# Patient Record
Sex: Male | Born: 1950 | Race: Black or African American | Hispanic: No | Marital: Married
Health system: Southern US, Community
[De-identification: ages and names within clinical notes are randomized; demographics above are authoritative.]

## PROBLEM LIST (undated history)

## (undated) DIAGNOSIS — E871 Hypo-osmolality and hyponatremia: Secondary | ICD-10-CM

## (undated) DIAGNOSIS — C9002 Multiple myeloma in relapse: Secondary | ICD-10-CM

## (undated) DIAGNOSIS — G4733 Obstructive sleep apnea (adult) (pediatric): Secondary | ICD-10-CM

## (undated) DIAGNOSIS — R5381 Other malaise: Secondary | ICD-10-CM

## (undated) DIAGNOSIS — I10 Essential (primary) hypertension: Secondary | ICD-10-CM

## (undated) DIAGNOSIS — G629 Polyneuropathy, unspecified: Secondary | ICD-10-CM

## (undated) DIAGNOSIS — K219 Gastro-esophageal reflux disease without esophagitis: Secondary | ICD-10-CM

## (undated) DIAGNOSIS — R634 Abnormal weight loss: Secondary | ICD-10-CM

## (undated) DIAGNOSIS — R630 Anorexia: Secondary | ICD-10-CM

## (undated) DIAGNOSIS — N400 Enlarged prostate without lower urinary tract symptoms: Secondary | ICD-10-CM

## (undated) HISTORY — DX: Essential (primary) hypertension: I10

## (undated) HISTORY — DX: Abnormal weight loss: R63.4

## (undated) HISTORY — DX: Obstructive sleep apnea (adult) (pediatric): G47.33

## (undated) HISTORY — DX: Hypo-osmolality and hyponatremia: E87.1

## (undated) HISTORY — DX: Polyneuropathy, unspecified: G62.9

## (undated) HISTORY — DX: Multiple myeloma in relapse: C90.02

## (undated) HISTORY — DX: Gastro-esophageal reflux disease without esophagitis: K21.9

## (undated) HISTORY — DX: Other malaise: R53.81

## (undated) HISTORY — DX: Anorexia: R63.0

## (undated) HISTORY — DX: Benign prostatic hyperplasia without lower urinary tract symptoms: N40.0

---

## 2000-06-17 ENCOUNTER — Encounter: Admission: RE | Admit: 2000-06-17 | Discharge: 2000-09-15 | Payer: Self-pay | Admitting: *Deleted

## 2002-03-24 ENCOUNTER — Encounter: Admission: RE | Admit: 2002-03-24 | Discharge: 2002-06-22 | Payer: Self-pay | Admitting: *Deleted

## 2002-08-18 ENCOUNTER — Encounter: Admission: RE | Admit: 2002-08-18 | Discharge: 2002-11-16 | Payer: Self-pay | Admitting: *Deleted

## 2006-01-21 ENCOUNTER — Encounter: Admission: RE | Admit: 2006-01-21 | Discharge: 2006-01-21 | Payer: Self-pay | Admitting: Internal Medicine

## 2006-12-05 ENCOUNTER — Encounter: Admission: RE | Admit: 2006-12-05 | Discharge: 2006-12-05 | Payer: Self-pay | Admitting: Internal Medicine

## 2007-09-01 ENCOUNTER — Encounter: Admission: RE | Admit: 2007-09-01 | Discharge: 2007-09-01 | Payer: Self-pay | Admitting: Internal Medicine

## 2008-06-01 ENCOUNTER — Encounter: Admission: RE | Admit: 2008-06-01 | Discharge: 2008-06-01 | Payer: Self-pay | Admitting: Internal Medicine

## 2008-08-14 ENCOUNTER — Ambulatory Visit (HOSPITAL_BASED_OUTPATIENT_CLINIC_OR_DEPARTMENT_OTHER): Admission: RE | Admit: 2008-08-14 | Discharge: 2008-08-14 | Payer: Self-pay | Admitting: Otolaryngology

## 2008-08-20 ENCOUNTER — Ambulatory Visit: Payer: Self-pay | Admitting: Internal Medicine

## 2009-02-13 ENCOUNTER — Ambulatory Visit (HOSPITAL_COMMUNITY): Admission: RE | Admit: 2009-02-13 | Discharge: 2009-02-13 | Payer: Self-pay | Admitting: Surgery

## 2009-02-15 ENCOUNTER — Ambulatory Visit (HOSPITAL_COMMUNITY): Admission: RE | Admit: 2009-02-15 | Discharge: 2009-02-15 | Payer: Self-pay | Admitting: Surgery

## 2009-03-02 ENCOUNTER — Ambulatory Visit (HOSPITAL_COMMUNITY): Admission: RE | Admit: 2009-03-02 | Discharge: 2009-03-02 | Payer: Self-pay | Admitting: Surgery

## 2009-03-02 ENCOUNTER — Encounter: Admission: RE | Admit: 2009-03-02 | Discharge: 2009-05-31 | Payer: Self-pay | Admitting: Surgery

## 2009-04-03 ENCOUNTER — Ambulatory Visit (HOSPITAL_COMMUNITY): Admission: RE | Admit: 2009-04-03 | Discharge: 2009-04-03 | Payer: Self-pay | Admitting: Surgery

## 2009-05-04 ENCOUNTER — Ambulatory Visit: Payer: Self-pay | Admitting: Oncology

## 2009-05-15 LAB — CBC WITH DIFFERENTIAL/PLATELET
BASO%: 0.6 % (ref 0.0–2.0)
Eosinophils Absolute: 0.1 10*3/uL (ref 0.0–0.5)
HCT: 33.5 % — ABNORMAL LOW (ref 38.4–49.9)
LYMPH%: 45.1 % (ref 14.0–49.0)
MONO#: 0.5 10*3/uL (ref 0.1–0.9)
NEUT#: 2.1 10*3/uL (ref 1.5–6.5)
Platelets: 75 10*3/uL — ABNORMAL LOW (ref 140–400)
RBC: 3.35 10*6/uL — ABNORMAL LOW (ref 4.20–5.82)
WBC: 4.9 10*3/uL (ref 4.0–10.3)
lymph#: 2.2 10*3/uL (ref 0.9–3.3)

## 2009-05-15 LAB — CHCC SMEAR

## 2009-05-15 LAB — MORPHOLOGY

## 2009-05-16 ENCOUNTER — Encounter: Payer: Self-pay | Admitting: Oncology

## 2009-05-16 ENCOUNTER — Other Ambulatory Visit: Admission: RE | Admit: 2009-05-16 | Discharge: 2009-05-16 | Payer: Self-pay | Admitting: Oncology

## 2009-05-16 LAB — LACTATE DEHYDROGENASE: LDH: 177 U/L (ref 94–250)

## 2009-05-17 LAB — PROTEIN ELECTROPHORESIS, SERUM
Alpha-2-Globulin: 7 % — ABNORMAL LOW (ref 7.1–11.8)
Beta 2: 1.3 % — ABNORMAL LOW (ref 3.2–6.5)
Beta Globulin: 5 % (ref 4.7–7.2)
Gamma Globulin: 28.5 % — ABNORMAL HIGH (ref 11.1–18.8)
M-Spike, %: 2.15 g/dL

## 2009-05-17 LAB — FERRITIN: Ferritin: 96 ng/mL (ref 22–322)

## 2009-05-17 LAB — IRON AND TIBC
%SAT: 40 % (ref 20–55)
Iron: 119 ug/dL (ref 42–165)
TIBC: 294 ug/dL (ref 215–435)
UIBC: 175 ug/dL

## 2009-05-17 LAB — COMPREHENSIVE METABOLIC PANEL
ALT: 15 U/L (ref 0–53)
BUN: 20 mg/dL (ref 6–23)
CO2: 29 mEq/L (ref 19–32)
Calcium: 9.1 mg/dL (ref 8.4–10.5)
Creatinine, Ser: 1.21 mg/dL (ref 0.40–1.50)
Glucose, Bld: 88 mg/dL (ref 70–99)
Total Bilirubin: 0.7 mg/dL (ref 0.3–1.2)

## 2009-05-19 ENCOUNTER — Ambulatory Visit (HOSPITAL_COMMUNITY): Admission: RE | Admit: 2009-05-19 | Discharge: 2009-05-19 | Payer: Self-pay | Admitting: Oncology

## 2009-05-23 LAB — IMMUNOFIXATION ELECTROPHORESIS
IgA: 9 mg/dL — ABNORMAL LOW (ref 68–378)
IgG (Immunoglobin G), Serum: 2670 mg/dL — ABNORMAL HIGH (ref 694–1618)
Total Protein, Serum Electrophoresis: 8.4 g/dL — ABNORMAL HIGH (ref 6.0–8.3)

## 2009-05-31 LAB — UIFE/LIGHT CHAINS/TP QN, 24-HR UR
Albumin, U: DETECTED
Free Kappa Lt Chains,Ur: 88.2 mg/dL — ABNORMAL HIGH (ref 0.04–1.51)
Free Lambda Excretion/Day: 1.58 mg/d
Free Lambda Lt Chains,Ur: 0.09 mg/dL (ref 0.08–1.01)
Gamma Globulin, Urine: DETECTED — AB
Time: 24 hours
Total Protein, Urine: 89.1 mg/dL
Volume, Urine: 1750 mL

## 2009-05-31 LAB — CREATININE CLEARANCE, URINE, 24 HOUR: Creatinine, Urine: 135.1 mg/dL

## 2009-06-07 LAB — COMPREHENSIVE METABOLIC PANEL
Alkaline Phosphatase: 33 U/L — ABNORMAL LOW (ref 39–117)
BUN: 14 mg/dL (ref 6–23)
Glucose, Bld: 134 mg/dL — ABNORMAL HIGH (ref 70–99)
Total Bilirubin: 0.9 mg/dL (ref 0.3–1.2)

## 2009-06-07 LAB — CBC WITH DIFFERENTIAL/PLATELET
Basophils Absolute: 0 10*3/uL (ref 0.0–0.1)
EOS%: 4.9 % (ref 0.0–7.0)
HCT: 28.4 % — ABNORMAL LOW (ref 38.4–49.9)
HGB: 9.8 g/dL — ABNORMAL LOW (ref 13.0–17.1)
LYMPH%: 38.6 % (ref 14.0–49.0)
MCH: 33.6 pg — ABNORMAL HIGH (ref 27.2–33.4)
MCV: 97.3 fL (ref 79.3–98.0)
MONO%: 15.7 % — ABNORMAL HIGH (ref 0.0–14.0)
NEUT%: 40.8 % (ref 39.0–75.0)
RDW: 13.8 % (ref 11.0–14.6)

## 2009-06-19 ENCOUNTER — Ambulatory Visit: Payer: Self-pay | Admitting: Oncology

## 2009-06-19 LAB — COMPREHENSIVE METABOLIC PANEL
AST: 16 U/L (ref 0–37)
Albumin: 3.5 g/dL (ref 3.5–5.2)
Alkaline Phosphatase: 38 U/L — ABNORMAL LOW (ref 39–117)
Potassium: 3.6 mEq/L (ref 3.5–5.3)
Sodium: 140 mEq/L (ref 135–145)
Total Protein: 5.9 g/dL — ABNORMAL LOW (ref 6.0–8.3)

## 2009-06-19 LAB — CBC WITH DIFFERENTIAL/PLATELET
BASO%: 0.2 % (ref 0.0–2.0)
EOS%: 7 % (ref 0.0–7.0)
MCH: 35.5 pg — ABNORMAL HIGH (ref 27.2–33.4)
MCHC: 35.3 g/dL (ref 32.0–36.0)
MCV: 100.8 fL — ABNORMAL HIGH (ref 79.3–98.0)
MONO%: 12.5 % (ref 0.0–14.0)
NEUT#: 1.8 10*3/uL (ref 1.5–6.5)
RBC: 2.91 10*6/uL — ABNORMAL LOW (ref 4.20–5.82)
RDW: 14.5 % (ref 11.0–14.6)

## 2009-06-21 LAB — PROTEIN ELECTROPHORESIS, SERUM
Albumin ELP: 61.3 % (ref 55.8–66.1)
Alpha-1-Globulin: 4.6 % (ref 2.9–4.9)
Alpha-2-Globulin: 8.9 % (ref 7.1–11.8)
Beta 2: 2.7 % — ABNORMAL LOW (ref 3.2–6.5)
Gamma Globulin: 17.2 % (ref 11.1–18.8)

## 2009-06-21 LAB — KAPPA/LAMBDA LIGHT CHAINS
Kappa free light chain: 4.58 mg/dL — ABNORMAL HIGH (ref 0.33–1.94)
Kappa:Lambda Ratio: 8.33 — ABNORMAL HIGH (ref 0.26–1.65)

## 2009-07-04 LAB — CBC WITH DIFFERENTIAL/PLATELET
Eosinophils Absolute: 1.2 10*3/uL — ABNORMAL HIGH (ref 0.0–0.5)
MONO#: 0.3 10*3/uL (ref 0.1–0.9)
NEUT#: 1.8 10*3/uL (ref 1.5–6.5)
Platelets: 110 10*3/uL — ABNORMAL LOW (ref 140–400)
RBC: 3.26 10*6/uL — ABNORMAL LOW (ref 4.20–5.82)
RDW: 13.7 % (ref 11.0–14.6)
WBC: 4.9 10*3/uL (ref 4.0–10.3)

## 2009-07-04 LAB — BASIC METABOLIC PANEL
CO2: 26 mEq/L (ref 19–32)
Chloride: 100 mEq/L (ref 96–112)
Glucose, Bld: 190 mg/dL — ABNORMAL HIGH (ref 70–99)
Potassium: 3.8 mEq/L (ref 3.5–5.3)
Sodium: 137 mEq/L (ref 135–145)

## 2009-07-14 LAB — CBC WITH DIFFERENTIAL/PLATELET
BASO%: 0.1 % (ref 0.0–2.0)
EOS%: 33.9 % — ABNORMAL HIGH (ref 0.0–7.0)
HCT: 32.5 % — ABNORMAL LOW (ref 38.4–49.9)
LYMPH%: 33 % (ref 14.0–49.0)
MCH: 34 pg — ABNORMAL HIGH (ref 27.2–33.4)
MCHC: 35 g/dL (ref 32.0–36.0)
MCV: 97.2 fL (ref 79.3–98.0)
MONO#: 0.3 10*3/uL (ref 0.1–0.9)
NEUT%: 27.4 % — ABNORMAL LOW (ref 39.0–75.0)
Platelets: 198 10*3/uL (ref 140–400)

## 2009-07-14 LAB — COMPREHENSIVE METABOLIC PANEL
ALT: 20 U/L (ref 0–53)
CO2: 30 mEq/L (ref 19–32)
Creatinine, Ser: 1.16 mg/dL (ref 0.40–1.50)
Total Bilirubin: 0.6 mg/dL (ref 0.3–1.2)

## 2009-07-18 LAB — PROTEIN ELECTROPHORESIS, SERUM
Beta Globulin: 5.8 % (ref 4.7–7.2)
M-Spike, %: 0.92 g/dL
Total Protein, Serum Electrophoresis: 6.4 g/dL (ref 6.0–8.3)

## 2009-07-18 LAB — KAPPA/LAMBDA LIGHT CHAINS: Lambda Free Lght Chn: 0.75 mg/dL (ref 0.57–2.63)

## 2009-07-18 LAB — IGG, IGA, IGM: IgG (Immunoglobin G), Serum: 1140 mg/dL (ref 694–1618)

## 2009-07-20 ENCOUNTER — Ambulatory Visit: Payer: Self-pay | Admitting: Oncology

## 2009-08-14 LAB — CBC WITH DIFFERENTIAL/PLATELET
BASO%: 0.2 % (ref 0.0–2.0)
EOS%: 24.4 % — ABNORMAL HIGH (ref 0.0–7.0)
HCT: 37.6 % — ABNORMAL LOW (ref 38.4–49.9)
LYMPH%: 29.2 % (ref 14.0–49.0)
MCH: 31.5 pg (ref 27.2–33.4)
MCHC: 33.4 g/dL (ref 32.0–36.0)
MCV: 94.4 fL (ref 79.3–98.0)
MONO%: 13.5 % (ref 0.0–14.0)
NEUT%: 32.7 % — ABNORMAL LOW (ref 39.0–75.0)
Platelets: 197 10*3/uL (ref 140–400)
RBC: 3.98 10*6/uL — ABNORMAL LOW (ref 4.20–5.82)
WBC: 4.7 10*3/uL (ref 4.0–10.3)

## 2009-08-16 LAB — COMPREHENSIVE METABOLIC PANEL
ALT: 15 U/L (ref 0–53)
Alkaline Phosphatase: 43 U/L (ref 39–117)
Creatinine, Ser: 1.07 mg/dL (ref 0.40–1.50)
Sodium: 139 mEq/L (ref 135–145)
Total Bilirubin: 0.5 mg/dL (ref 0.3–1.2)
Total Protein: 6.6 g/dL (ref 6.0–8.3)

## 2009-08-16 LAB — SPEP & IFE WITH QIG
Albumin ELP: 56 % (ref 55.8–66.1)
Alpha-2-Globulin: 9.3 % (ref 7.1–11.8)
IgG (Immunoglobin G), Serum: 1390 mg/dL (ref 694–1618)
M-Spike, %: 1.13 g/dL
Total Protein, Serum Electrophoresis: 6.6 g/dL (ref 6.0–8.3)

## 2009-08-17 ENCOUNTER — Ambulatory Visit: Payer: Self-pay | Admitting: Oncology

## 2009-09-14 ENCOUNTER — Ambulatory Visit: Payer: Self-pay | Admitting: Oncology

## 2009-09-14 ENCOUNTER — Ambulatory Visit (HOSPITAL_COMMUNITY): Admission: RE | Admit: 2009-09-14 | Discharge: 2009-09-14 | Payer: Self-pay | Admitting: Oncology

## 2009-09-14 ENCOUNTER — Encounter: Payer: Self-pay | Admitting: Oncology

## 2009-09-15 LAB — CBC WITH DIFFERENTIAL/PLATELET
Basophils Absolute: 0 10*3/uL (ref 0.0–0.1)
EOS%: 3 % (ref 0.0–7.0)
Eosinophils Absolute: 0.1 10*3/uL (ref 0.0–0.5)
HCT: 37.1 % — ABNORMAL LOW (ref 38.4–49.9)
HGB: 12.5 g/dL — ABNORMAL LOW (ref 13.0–17.1)
MCH: 30.9 pg (ref 27.2–33.4)
MCV: 91.5 fL (ref 79.3–98.0)
MONO%: 14.3 % — ABNORMAL HIGH (ref 0.0–14.0)
NEUT%: 42.2 % (ref 39.0–75.0)
Platelets: 223 10*3/uL (ref 140–400)

## 2009-09-19 LAB — COMPREHENSIVE METABOLIC PANEL
AST: 9 U/L (ref 0–37)
Alkaline Phosphatase: 37 U/L — ABNORMAL LOW (ref 39–117)
BUN: 15 mg/dL (ref 6–23)
Calcium: 8.3 mg/dL — ABNORMAL LOW (ref 8.4–10.5)
Creatinine, Ser: 1 mg/dL (ref 0.40–1.50)
Glucose, Bld: 121 mg/dL — ABNORMAL HIGH (ref 70–99)

## 2009-09-19 LAB — KAPPA/LAMBDA LIGHT CHAINS
Kappa:Lambda Ratio: 1.89 — ABNORMAL HIGH (ref 0.26–1.65)
Lambda Free Lght Chn: 0.55 mg/dL — ABNORMAL LOW (ref 0.57–2.63)

## 2009-09-19 LAB — SPEP & IFE WITH QIG
Albumin ELP: 57.5 % (ref 55.8–66.1)
Alpha-2-Globulin: 9.9 % (ref 7.1–11.8)
Beta Globulin: 6.2 % (ref 4.7–7.2)
Total Protein, Serum Electrophoresis: 6.2 g/dL (ref 6.0–8.3)

## 2009-09-21 ENCOUNTER — Ambulatory Visit: Payer: Self-pay | Admitting: Oncology

## 2009-09-25 LAB — BASIC METABOLIC PANEL
BUN: 15 mg/dL (ref 6–23)
Chloride: 101 mEq/L (ref 96–112)
Glucose, Bld: 186 mg/dL — ABNORMAL HIGH (ref 70–99)
Potassium: 4.1 mEq/L (ref 3.5–5.3)
Sodium: 136 mEq/L (ref 135–145)

## 2009-09-25 LAB — CBC WITH DIFFERENTIAL/PLATELET
Basophils Absolute: 0 10*3/uL (ref 0.0–0.1)
Eosinophils Absolute: 0.1 10*3/uL (ref 0.0–0.5)
HCT: 37.9 % — ABNORMAL LOW (ref 38.4–49.9)
LYMPH%: 27.6 % (ref 14.0–49.0)
MCV: 89.4 fL (ref 79.3–98.0)
MONO%: 10 % (ref 0.0–14.0)
NEUT#: 3.6 10*3/uL (ref 1.5–6.5)
NEUT%: 60.6 % (ref 39.0–75.0)
Platelets: 168 10*3/uL (ref 140–400)
RBC: 4.24 10*6/uL (ref 4.20–5.82)

## 2009-10-02 LAB — CBC WITH DIFFERENTIAL/PLATELET
BASO%: 0 % (ref 0.0–2.0)
EOS%: 5.5 % (ref 0.0–7.0)
HCT: 38.4 % (ref 38.4–49.9)
LYMPH%: 29.8 % (ref 14.0–49.0)
MCH: 29.8 pg (ref 27.2–33.4)
MCHC: 32.6 g/dL (ref 32.0–36.0)
MCV: 91.4 fL (ref 79.3–98.0)
MONO%: 12.1 % (ref 0.0–14.0)
NEUT%: 52.6 % (ref 39.0–75.0)
Platelets: 172 10*3/uL (ref 140–400)

## 2009-10-05 LAB — CBC WITH DIFFERENTIAL/PLATELET
BASO%: 0.1 % (ref 0.0–2.0)
EOS%: 2.6 % (ref 0.0–7.0)
MCH: 30.3 pg (ref 27.2–33.4)
MCHC: 33.2 g/dL (ref 32.0–36.0)
MONO%: 18.3 % — ABNORMAL HIGH (ref 0.0–14.0)
RDW: 14.1 % (ref 11.0–14.6)
lymph#: 2.6 10*3/uL (ref 0.9–3.3)

## 2009-10-05 LAB — BASIC METABOLIC PANEL
Calcium: 8.3 mg/dL — ABNORMAL LOW (ref 8.4–10.5)
Creatinine, Ser: 1.14 mg/dL (ref 0.40–1.50)
Glucose, Bld: 181 mg/dL — ABNORMAL HIGH (ref 70–99)
Sodium: 136 mEq/L (ref 135–145)

## 2009-10-05 LAB — TECHNOLOGIST REVIEW

## 2009-10-10 LAB — COMPREHENSIVE METABOLIC PANEL
AST: 17 U/L (ref 0–37)
Albumin: 3.1 g/dL — ABNORMAL LOW (ref 3.5–5.2)
BUN: 12 mg/dL (ref 6–23)
Calcium: 8.5 mg/dL (ref 8.4–10.5)
Chloride: 100 mEq/L (ref 96–112)
Creatinine, Ser: 1.13 mg/dL (ref 0.40–1.50)
Glucose, Bld: 168 mg/dL — ABNORMAL HIGH (ref 70–99)
Potassium: 3.5 mEq/L (ref 3.5–5.3)

## 2009-10-10 LAB — CBC WITH DIFFERENTIAL/PLATELET
Basophils Absolute: 0 10*3/uL (ref 0.0–0.1)
EOS%: 5.8 % (ref 0.0–7.0)
Eosinophils Absolute: 0.3 10*3/uL (ref 0.0–0.5)
HCT: 37.3 % — ABNORMAL LOW (ref 38.4–49.9)
HGB: 12.6 g/dL — ABNORMAL LOW (ref 13.0–17.1)
MCH: 31.2 pg (ref 27.2–33.4)
MCV: 92.4 fL (ref 79.3–98.0)
MONO%: 18.2 % — ABNORMAL HIGH (ref 0.0–14.0)
NEUT#: 2.9 10*3/uL (ref 1.5–6.5)
NEUT%: 57.6 % (ref 39.0–75.0)
lymph#: 0.9 10*3/uL (ref 0.9–3.3)

## 2009-10-12 LAB — SPEP & IFE WITH QIG
Alpha-1-Globulin: 4.3 % (ref 2.9–4.9)
Alpha-2-Globulin: 10.1 % (ref 7.1–11.8)
Beta 2: 2.5 % — ABNORMAL LOW (ref 3.2–6.5)
Beta Globulin: 5.9 % (ref 4.7–7.2)
Gamma Globulin: 22.7 % — ABNORMAL HIGH (ref 11.1–18.8)
IgG (Immunoglobin G), Serum: 1580 mg/dL (ref 694–1618)
M-Spike, %: 1.3 g/dL

## 2009-10-12 LAB — KAPPA/LAMBDA LIGHT CHAINS
Kappa free light chain: 1.33 mg/dL (ref 0.33–1.94)
Kappa:Lambda Ratio: 1.66 — ABNORMAL HIGH (ref 0.26–1.65)
Lambda Free Lght Chn: 0.8 mg/dL (ref 0.57–2.63)

## 2009-10-16 LAB — CBC WITH DIFFERENTIAL/PLATELET
BASO%: 0.4 % (ref 0.0–2.0)
Eosinophils Absolute: 0.3 10*3/uL (ref 0.0–0.5)
HCT: 36.9 % — ABNORMAL LOW (ref 38.4–49.9)
LYMPH%: 32 % (ref 14.0–49.0)
MCHC: 32.8 g/dL (ref 32.0–36.0)
MONO#: 0.6 10*3/uL (ref 0.1–0.9)
NEUT#: 2.7 10*3/uL (ref 1.5–6.5)
Platelets: 191 10*3/uL (ref 140–400)
RBC: 4.04 10*6/uL — ABNORMAL LOW (ref 4.20–5.82)
WBC: 5.2 10*3/uL (ref 4.0–10.3)
lymph#: 1.7 10*3/uL (ref 0.9–3.3)
nRBC: 0 % (ref 0–0)

## 2009-10-18 LAB — PROTEIN ELECTROPHORESIS, SERUM
Albumin ELP: 51 % — ABNORMAL LOW (ref 55.8–66.1)
Alpha-1-Globulin: 4.3 % (ref 2.9–4.9)
Alpha-2-Globulin: 10.4 % (ref 7.1–11.8)
Beta 2: 2.6 % — ABNORMAL LOW (ref 3.2–6.5)
Gamma Globulin: 25.9 % — ABNORMAL HIGH (ref 11.1–18.8)

## 2009-10-18 LAB — COMPREHENSIVE METABOLIC PANEL
Alkaline Phosphatase: 36 U/L — ABNORMAL LOW (ref 39–117)
BUN: 14 mg/dL (ref 6–23)
CO2: 25 mEq/L (ref 19–32)
Creatinine, Ser: 0.92 mg/dL (ref 0.40–1.50)
Glucose, Bld: 134 mg/dL — ABNORMAL HIGH (ref 70–99)
Sodium: 135 mEq/L (ref 135–145)
Total Bilirubin: 0.4 mg/dL (ref 0.3–1.2)
Total Protein: 7.2 g/dL (ref 6.0–8.3)

## 2009-10-18 LAB — IGG, IGA, IGM
IgA: 40 mg/dL — ABNORMAL LOW (ref 68–378)
IgG (Immunoglobin G), Serum: 2010 mg/dL — ABNORMAL HIGH (ref 694–1618)
IgM, Serum: 15 mg/dL — ABNORMAL LOW (ref 60–263)

## 2009-10-18 LAB — KAPPA/LAMBDA LIGHT CHAINS
Kappa free light chain: 1.12 mg/dL (ref 0.33–1.94)
Kappa:Lambda Ratio: 1.56 (ref 0.26–1.65)

## 2009-10-23 ENCOUNTER — Ambulatory Visit: Payer: Self-pay | Admitting: Oncology

## 2009-10-23 LAB — CBC WITH DIFFERENTIAL/PLATELET
Basophils Absolute: 0 10*3/uL (ref 0.0–0.1)
Eosinophils Absolute: 0.3 10*3/uL (ref 0.0–0.5)
HCT: 38 % — ABNORMAL LOW (ref 38.4–49.9)
HGB: 12.6 g/dL — ABNORMAL LOW (ref 13.0–17.1)
MONO#: 0.5 10*3/uL (ref 0.1–0.9)
NEUT#: 2.4 10*3/uL (ref 1.5–6.5)
NEUT%: 47.9 % (ref 39.0–75.0)
RDW: 14.4 % (ref 11.0–14.6)
lymph#: 1.9 10*3/uL (ref 0.9–3.3)

## 2009-10-23 LAB — COMPREHENSIVE METABOLIC PANEL
AST: 10 U/L (ref 0–37)
Albumin: 3.3 g/dL — ABNORMAL LOW (ref 3.5–5.2)
BUN: 13 mg/dL (ref 6–23)
CO2: 30 mEq/L (ref 19–32)
Calcium: 8.3 mg/dL — ABNORMAL LOW (ref 8.4–10.5)
Chloride: 97 mEq/L (ref 96–112)
Creatinine, Ser: 0.95 mg/dL (ref 0.40–1.50)
Glucose, Bld: 217 mg/dL — ABNORMAL HIGH (ref 70–99)
Potassium: 3.4 mEq/L — ABNORMAL LOW (ref 3.5–5.3)

## 2009-10-25 LAB — IGG, IGA, IGM: IgG (Immunoglobin G), Serum: 1910 mg/dL — ABNORMAL HIGH (ref 694–1618)

## 2009-10-25 LAB — PROTEIN ELECTROPHORESIS, SERUM
Beta 2: 2.5 % — ABNORMAL LOW (ref 3.2–6.5)
Beta Globulin: 5.6 % (ref 4.7–7.2)
Gamma Globulin: 26 % — ABNORMAL HIGH (ref 11.1–18.8)
M-Spike, %: 1.64 g/dL
Total Protein, Serum Electrophoresis: 6.7 g/dL (ref 6.0–8.3)

## 2009-10-30 LAB — CBC WITH DIFFERENTIAL/PLATELET
BASO%: 0.3 % (ref 0.0–2.0)
EOS%: 2.9 % (ref 0.0–7.0)
HCT: 37.6 % — ABNORMAL LOW (ref 38.4–49.9)
MCH: 31 pg (ref 27.2–33.4)
MCHC: 33.2 g/dL (ref 32.0–36.0)
MONO#: 1 10*3/uL — ABNORMAL HIGH (ref 0.1–0.9)
RDW: 15.1 % — ABNORMAL HIGH (ref 11.0–14.6)
WBC: 6 10*3/uL (ref 4.0–10.3)
lymph#: 1.3 10*3/uL (ref 0.9–3.3)

## 2009-10-30 LAB — COMPREHENSIVE METABOLIC PANEL
Albumin: 2.8 g/dL — ABNORMAL LOW (ref 3.5–5.2)
Alkaline Phosphatase: 39 U/L (ref 39–117)
BUN: 9 mg/dL (ref 6–23)
CO2: 31 mEq/L (ref 19–32)
Calcium: 8.3 mg/dL — ABNORMAL LOW (ref 8.4–10.5)
Chloride: 100 mEq/L (ref 96–112)
Glucose, Bld: 192 mg/dL — ABNORMAL HIGH (ref 70–99)
Potassium: 3.3 mEq/L — ABNORMAL LOW (ref 3.5–5.3)
Total Protein: 6.1 g/dL (ref 6.0–8.3)

## 2009-11-01 LAB — PROTEIN ELECTROPHORESIS, SERUM
Beta 2: 3 % — ABNORMAL LOW (ref 3.2–6.5)
Gamma Globulin: 23.8 % — ABNORMAL HIGH (ref 11.1–18.8)
M-Spike, %: 1.35 g/dL

## 2009-11-01 LAB — IGG, IGA, IGM
IgA: 23 mg/dL — ABNORMAL LOW (ref 68–378)
IgG (Immunoglobin G), Serum: 1480 mg/dL (ref 694–1618)
IgM, Serum: 14 mg/dL — ABNORMAL LOW (ref 60–263)

## 2009-11-01 LAB — KAPPA/LAMBDA LIGHT CHAINS: Kappa free light chain: 1 mg/dL (ref 0.33–1.94)

## 2009-11-06 LAB — CBC WITH DIFFERENTIAL/PLATELET
BASO%: 0.3 % (ref 0.0–2.0)
HCT: 37.5 % — ABNORMAL LOW (ref 38.4–49.9)
LYMPH%: 32.8 % (ref 14.0–49.0)
MCHC: 32.5 g/dL (ref 32.0–36.0)
MCV: 92.6 fL (ref 79.3–98.0)
MONO#: 0.6 10*3/uL (ref 0.1–0.9)
MONO%: 9.9 % (ref 0.0–14.0)
NEUT%: 52.3 % (ref 39.0–75.0)
Platelets: 182 10*3/uL (ref 140–400)
RBC: 4.05 10*6/uL — ABNORMAL LOW (ref 4.20–5.82)

## 2009-11-06 LAB — COMPREHENSIVE METABOLIC PANEL
AST: 16 U/L (ref 0–37)
Albumin: 3.6 g/dL (ref 3.5–5.2)
Alkaline Phosphatase: 38 U/L — ABNORMAL LOW (ref 39–117)
BUN: 21 mg/dL (ref 6–23)
Creatinine, Ser: 1.07 mg/dL (ref 0.40–1.50)
Glucose, Bld: 152 mg/dL — ABNORMAL HIGH (ref 70–99)
Total Bilirubin: 0.4 mg/dL (ref 0.3–1.2)

## 2009-11-16 ENCOUNTER — Ambulatory Visit (HOSPITAL_COMMUNITY): Admission: RE | Admit: 2009-11-16 | Discharge: 2009-11-16 | Payer: Self-pay | Admitting: Oncology

## 2009-11-16 ENCOUNTER — Ambulatory Visit: Payer: Self-pay | Admitting: Oncology

## 2009-11-16 LAB — CBC WITH DIFFERENTIAL/PLATELET
BASO%: 0 % (ref 0.0–2.0)
Basophils Absolute: 0 10*3/uL (ref 0.0–0.1)
EOS%: 0.4 % (ref 0.0–7.0)
Eosinophils Absolute: 0 10*3/uL (ref 0.0–0.5)
HCT: 38 % — ABNORMAL LOW (ref 38.4–49.9)
HGB: 12.4 g/dL — ABNORMAL LOW (ref 13.0–17.1)
LYMPH%: 37.6 % (ref 14.0–49.0)
MCH: 30.2 pg (ref 27.2–33.4)
MCHC: 32.6 g/dL (ref 32.0–36.0)
MCV: 92.7 fL (ref 79.3–98.0)
MONO#: 0.9 10*3/uL (ref 0.1–0.9)
MONO%: 12.3 % (ref 0.0–14.0)
NEUT#: 3.5 10*3/uL (ref 1.5–6.5)
NEUT%: 49.7 % (ref 39.0–75.0)
Platelets: 164 10*3/uL (ref 140–400)
RBC: 4.1 10*6/uL — ABNORMAL LOW (ref 4.20–5.82)
RDW: 15 % — ABNORMAL HIGH (ref 11.0–14.6)
WBC: 7 10*3/uL (ref 4.0–10.3)
lymph#: 2.6 10*3/uL (ref 0.9–3.3)
nRBC: 0 % (ref 0–0)

## 2009-11-20 LAB — PROTEIN ELECTROPHORESIS, SERUM
Alpha-2-Globulin: 10 % (ref 7.1–11.8)
Beta Globulin: 5 % (ref 4.7–7.2)
Gamma Globulin: 29.8 % — ABNORMAL HIGH (ref 11.1–18.8)
M-Spike, %: 1.75 g/dL
Total Protein, Serum Electrophoresis: 6.3 g/dL (ref 6.0–8.3)

## 2009-11-20 LAB — COMPREHENSIVE METABOLIC PANEL
ALT: 16 U/L (ref 0–53)
AST: 12 U/L (ref 0–37)
Albumin: 3 g/dL — ABNORMAL LOW (ref 3.5–5.2)
BUN: 18 mg/dL (ref 6–23)
CO2: 27 mEq/L (ref 19–32)
Calcium: 8.5 mg/dL (ref 8.4–10.5)
Chloride: 101 mEq/L (ref 96–112)
Creatinine, Ser: 0.94 mg/dL (ref 0.40–1.50)
Potassium: 3.6 mEq/L (ref 3.5–5.3)

## 2009-11-20 LAB — KAPPA/LAMBDA LIGHT CHAINS
Kappa free light chain: 1.02 mg/dL (ref 0.33–1.94)
Kappa:Lambda Ratio: 1.62 (ref 0.26–1.65)

## 2009-11-21 LAB — UIFE/LIGHT CHAINS/TP QN, 24-HR UR
Beta, Urine: DETECTED — AB
Free Lambda Lt Chains,Ur: 0.1 mg/dL (ref 0.08–1.01)
Free Lt Chn Excr Rate: 287.7 mg/d
Gamma Globulin, Urine: DETECTED — AB
Total Protein, Urine-Ur/day: 306 mg/d — ABNORMAL HIGH (ref 10–140)
Volume, Urine: 3000 mL

## 2009-12-07 ENCOUNTER — Ambulatory Visit: Payer: Self-pay | Admitting: Oncology

## 2009-12-11 LAB — CBC WITH DIFFERENTIAL/PLATELET
Basophils Absolute: 0 10*3/uL (ref 0.0–0.1)
Eosinophils Absolute: 0.1 10*3/uL (ref 0.0–0.5)
HCT: 33.3 % — ABNORMAL LOW (ref 38.4–49.9)
LYMPH%: 9.9 % — ABNORMAL LOW (ref 14.0–49.0)
MCV: 94.8 fL (ref 79.3–98.0)
MONO#: 0.1 10*3/uL (ref 0.1–0.9)
MONO%: 1.8 % (ref 0.0–14.0)
NEUT#: 2.7 10*3/uL (ref 1.5–6.5)
NEUT%: 85.7 % — ABNORMAL HIGH (ref 39.0–75.0)
Platelets: 142 10*3/uL (ref 140–400)
RBC: 3.51 10*6/uL — ABNORMAL LOW (ref 4.20–5.82)

## 2009-12-11 LAB — COMPREHENSIVE METABOLIC PANEL
ALT: 10 U/L (ref 0–53)
AST: 11 U/L (ref 0–37)
Albumin: 3.1 g/dL — ABNORMAL LOW (ref 3.5–5.2)
Alkaline Phosphatase: 32 U/L — ABNORMAL LOW (ref 39–117)
Calcium: 8.5 mg/dL (ref 8.4–10.5)
Chloride: 95 mEq/L — ABNORMAL LOW (ref 96–112)
Creatinine, Ser: 1.19 mg/dL (ref 0.40–1.50)
Potassium: 4 mEq/L (ref 3.5–5.3)

## 2009-12-13 LAB — CBC WITH DIFFERENTIAL/PLATELET
BASO%: 0.6 % (ref 0.0–2.0)
EOS%: 2.9 % (ref 0.0–7.0)
HCT: 31.9 % — ABNORMAL LOW (ref 38.4–49.9)
MCH: 32.3 pg (ref 27.2–33.4)
MCHC: 34.2 g/dL (ref 32.0–36.0)
MCV: 94.3 fL (ref 79.3–98.0)
MONO%: 5.8 % (ref 0.0–14.0)
NEUT%: 58.3 % (ref 39.0–75.0)
lymph#: 0.3 10*3/uL — ABNORMAL LOW (ref 0.9–3.3)

## 2009-12-13 LAB — COMPREHENSIVE METABOLIC PANEL
ALT: 11 U/L (ref 0–53)
AST: 9 U/L (ref 0–37)
Alkaline Phosphatase: 34 U/L — ABNORMAL LOW (ref 39–117)
BUN: 16 mg/dL (ref 6–23)
Calcium: 8.4 mg/dL (ref 8.4–10.5)
Chloride: 97 mEq/L (ref 96–112)
Creatinine, Ser: 1.05 mg/dL (ref 0.40–1.50)
Total Bilirubin: 0.6 mg/dL (ref 0.3–1.2)

## 2009-12-15 LAB — CBC WITH DIFFERENTIAL/PLATELET
Eosinophils Absolute: 0 10*3/uL (ref 0.0–0.5)
MONO#: 0.1 10*3/uL (ref 0.1–0.9)
MONO%: 20.1 % — ABNORMAL HIGH (ref 0.0–14.0)
NEUT#: 0 10*3/uL — CL (ref 1.5–6.5)
RBC: 3.31 10*6/uL — ABNORMAL LOW (ref 4.20–5.82)
RDW: 14.3 % (ref 11.0–14.6)
WBC: 0.5 10*3/uL — CL (ref 4.0–10.3)
lymph#: 0.3 10*3/uL — ABNORMAL LOW (ref 0.9–3.3)

## 2009-12-15 LAB — COMPREHENSIVE METABOLIC PANEL
Albumin: 2.7 g/dL — ABNORMAL LOW (ref 3.5–5.2)
Alkaline Phosphatase: 33 U/L — ABNORMAL LOW (ref 39–117)
CO2: 29 mEq/L (ref 19–32)
Glucose, Bld: 268 mg/dL — ABNORMAL HIGH (ref 70–99)
Potassium: 3.9 mEq/L (ref 3.5–5.3)
Sodium: 128 mEq/L — ABNORMAL LOW (ref 135–145)
Total Protein: 9.2 g/dL — ABNORMAL HIGH (ref 6.0–8.3)

## 2009-12-19 ENCOUNTER — Emergency Department (HOSPITAL_COMMUNITY): Admission: EM | Admit: 2009-12-19 | Discharge: 2009-12-19 | Payer: Self-pay | Admitting: Emergency Medicine

## 2009-12-27 LAB — CBC WITH DIFFERENTIAL/PLATELET
BASO%: 0.3 % (ref 0.0–2.0)
Eosinophils Absolute: 0 10*3/uL (ref 0.0–0.5)
HGB: 10.3 g/dL — ABNORMAL LOW (ref 13.0–17.1)
LYMPH%: 11.3 % — ABNORMAL LOW (ref 14.0–49.0)
MONO#: 0.4 10*3/uL (ref 0.1–0.9)
MONO%: 7.7 % (ref 0.0–14.0)
NEUT#: 4.5 10*3/uL (ref 1.5–6.5)
NEUT%: 80.7 % — ABNORMAL HIGH (ref 39.0–75.0)
RDW: 14.6 % (ref 11.0–14.6)
WBC: 5.6 10*3/uL (ref 4.0–10.3)
lymph#: 0.6 10*3/uL — ABNORMAL LOW (ref 0.9–3.3)

## 2010-01-01 LAB — PROTEIN ELECTROPHORESIS, SERUM
Albumin ELP: 39.3 % — ABNORMAL LOW (ref 55.8–66.1)
Alpha-1-Globulin: 4.1 % (ref 2.9–4.9)
Beta 2: 2.7 % — ABNORMAL LOW (ref 3.2–6.5)
Beta Globulin: 4.3 % — ABNORMAL LOW (ref 4.7–7.2)
Total Protein, Serum Electrophoresis: 8.4 g/dL — ABNORMAL HIGH (ref 6.0–8.3)

## 2010-01-01 LAB — COMPREHENSIVE METABOLIC PANEL
ALT: 10 U/L (ref 0–53)
Alkaline Phosphatase: 34 U/L — ABNORMAL LOW (ref 39–117)
Creatinine, Ser: 1.06 mg/dL (ref 0.40–1.50)
Sodium: 131 mEq/L — ABNORMAL LOW (ref 135–145)
Total Bilirubin: 0.3 mg/dL (ref 0.3–1.2)
Total Protein: 8.4 g/dL — ABNORMAL HIGH (ref 6.0–8.3)

## 2010-01-01 LAB — IGG, IGA, IGM
IgA: 21 mg/dL — ABNORMAL LOW (ref 68–378)
IgG (Immunoglobin G), Serum: 4390 mg/dL — ABNORMAL HIGH (ref 694–1618)

## 2010-01-01 LAB — KAPPA/LAMBDA LIGHT CHAINS: Lambda Free Lght Chn: <0.47 mg/dL — ABNORMAL LOW (ref 0.57–2.63)

## 2010-01-29 ENCOUNTER — Ambulatory Visit: Payer: Self-pay | Admitting: Oncology

## 2010-01-31 LAB — CBC WITH DIFFERENTIAL/PLATELET
BASO%: 0.3 % (ref 0.0–2.0)
Basophils Absolute: 0 10*3/uL (ref 0.0–0.1)
EOS%: 1.2 % (ref 0.0–7.0)
MCH: 32.1 pg (ref 27.2–33.4)
MCHC: 33.7 g/dL (ref 32.0–36.0)
MCV: 95.3 fL (ref 79.3–98.0)
MONO%: 13 % (ref 0.0–14.0)
NEUT%: 43.6 % (ref 39.0–75.0)
RDW: 15.5 % — ABNORMAL HIGH (ref 11.0–14.6)
lymph#: 4 10*3/uL — ABNORMAL HIGH (ref 0.9–3.3)

## 2010-01-31 LAB — COMPREHENSIVE METABOLIC PANEL
ALT: 11 U/L (ref 0–53)
AST: 16 U/L (ref 0–37)
Creatinine, Ser: 0.97 mg/dL (ref 0.40–1.50)
Total Bilirubin: 0.4 mg/dL (ref 0.3–1.2)

## 2010-03-19 ENCOUNTER — Ambulatory Visit: Payer: Self-pay | Admitting: Oncology

## 2010-03-26 LAB — COMPREHENSIVE METABOLIC PANEL
ALT: 11 U/L (ref 0–53)
Albumin: 3.4 g/dL — ABNORMAL LOW (ref 3.5–5.2)
Alkaline Phosphatase: 38 U/L — ABNORMAL LOW (ref 39–117)
Glucose, Bld: 98 mg/dL (ref 70–99)
Potassium: 3.8 mEq/L (ref 3.5–5.3)
Total Bilirubin: 0.4 mg/dL (ref 0.3–1.2)
Total Protein: 8.5 g/dL — ABNORMAL HIGH (ref 6.0–8.3)

## 2010-03-26 LAB — CBC WITH DIFFERENTIAL/PLATELET
BASO%: 0.3 % (ref 0.0–2.0)
Basophils Absolute: 0 10*3/uL (ref 0.0–0.1)
EOS%: 3.3 % (ref 0.0–7.0)
HGB: 12 g/dL — ABNORMAL LOW (ref 13.0–17.1)
MCH: 32.5 pg (ref 27.2–33.4)
MCV: 97.1 fL (ref 79.3–98.0)
MONO%: 8.8 % (ref 0.0–14.0)
NEUT#: 3.5 10*3/uL (ref 1.5–6.5)
RBC: 3.69 10*6/uL — ABNORMAL LOW (ref 4.20–5.82)
RDW: 13.9 % (ref 11.0–14.6)
lymph#: 2 10*3/uL (ref 0.9–3.3)

## 2010-05-08 ENCOUNTER — Ambulatory Visit: Payer: Self-pay | Admitting: Oncology

## 2010-05-09 LAB — CBC WITH DIFFERENTIAL/PLATELET
Basophils Absolute: 0 10*3/uL (ref 0.0–0.1)
Eosinophils Absolute: 0 10*3/uL (ref 0.0–0.5)
HCT: 33.4 % — ABNORMAL LOW (ref 38.4–49.9)
HGB: 11.5 g/dL — ABNORMAL LOW (ref 13.0–17.1)
LYMPH%: 16.8 % (ref 14.0–49.0)
MONO#: 0.7 10*3/uL (ref 0.1–0.9)
NEUT#: 3.7 10*3/uL (ref 1.5–6.5)
Platelets: 114 10*3/uL — ABNORMAL LOW (ref 140–400)
RBC: 3.52 10*6/uL — ABNORMAL LOW (ref 4.20–5.82)
WBC: 5.3 10*3/uL (ref 4.0–10.3)

## 2010-05-11 LAB — COMPREHENSIVE METABOLIC PANEL
ALT: 18 U/L (ref 0–53)
CO2: 23 mEq/L (ref 19–32)
Calcium: 8.9 mg/dL (ref 8.4–10.5)
Chloride: 99 mEq/L (ref 96–112)
Creatinine, Ser: 0.94 mg/dL (ref 0.40–1.50)
Glucose, Bld: 180 mg/dL — ABNORMAL HIGH (ref 70–99)
Total Protein: 8.3 g/dL (ref 6.0–8.3)

## 2010-05-11 LAB — PROTEIN ELECTROPHORESIS, SERUM
Alpha-1-Globulin: 6.7 % — ABNORMAL HIGH (ref 2.9–4.9)
Beta 2: 1.8 % — ABNORMAL LOW (ref 3.2–6.5)
Gamma Globulin: 31.8 % — ABNORMAL HIGH (ref 11.1–18.8)
M-Spike, %: 2.5 g/dL

## 2010-08-06 ENCOUNTER — Ambulatory Visit: Payer: Self-pay | Admitting: Oncology

## 2010-09-21 IMAGING — CR DG CHEST 2V
2 series · 2 of 2 positions shown · non-contrast
Comparison: 06/01/2008

CLINICAL DATA: Nonsmoker.  History of hypertension.  Bariatric
evaluation.

CHEST - 2 VIEW

[view not recorded (1 of 2)]
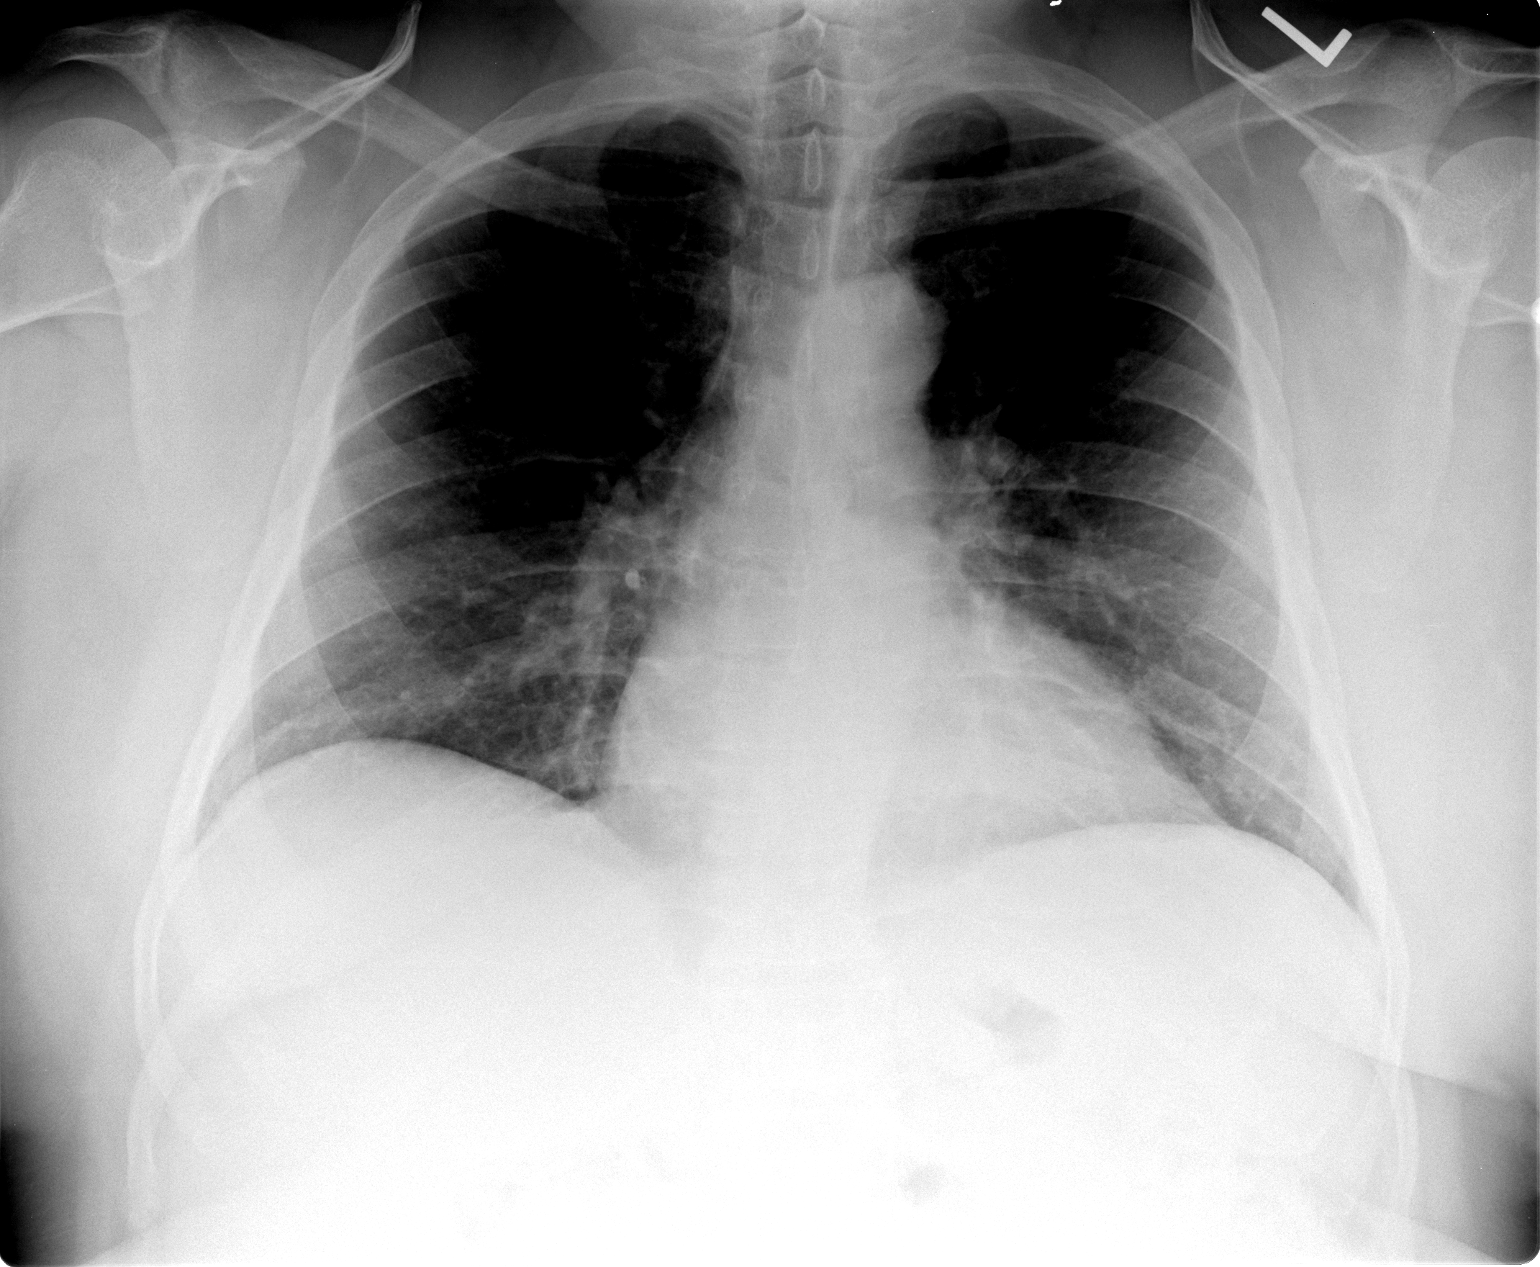

[view not recorded (2 of 2)]
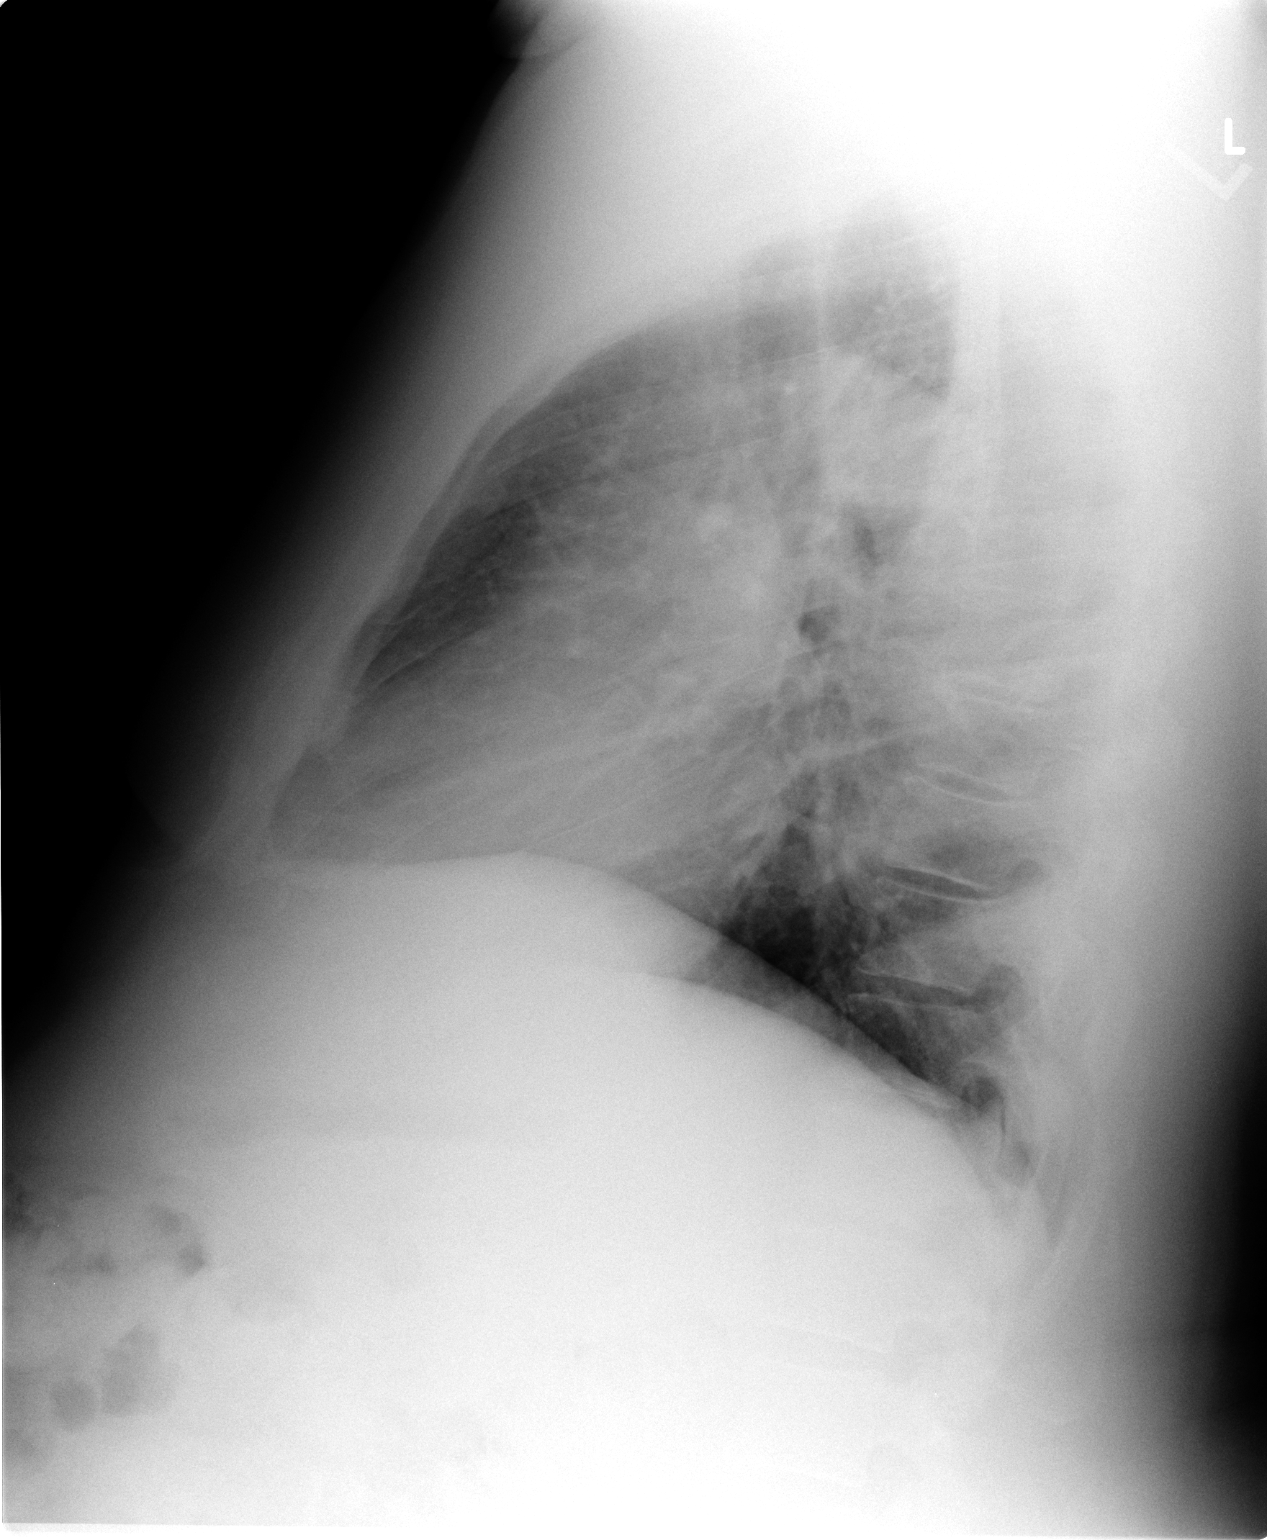

[2 of 2 positions shown; findings below may reference images not displayed]

FINDINGS: Heart size is mildly enlarged.  The there are no focal
consolidations or pleural effusions.  No evidence for pulmonary
edema.  Visualized bowel gas pattern is nonobstructive.
IMPRESSION: No evidence for acute cardiopulmonary disease.  Mild cardiomegaly.

REF:W1 DICTATED: 02/13/2009 [DATE]

## 2011-01-21 ENCOUNTER — Encounter: Payer: Self-pay | Admitting: Oncology

## 2011-01-29 ENCOUNTER — Emergency Department (HOSPITAL_COMMUNITY)
Admission: EM | Admit: 2011-01-29 | Discharge: 2011-01-29 | Payer: Self-pay | Source: Home / Self Care | Admitting: Emergency Medicine

## 2011-01-29 LAB — BASIC METABOLIC PANEL
CO2: 28 mEq/L (ref 19–32)
Calcium: 8.5 mg/dL (ref 8.4–10.5)
Chloride: 99 mEq/L (ref 96–112)
Glucose, Bld: 165 mg/dL — ABNORMAL HIGH (ref 70–99)
Potassium: 4.3 mEq/L (ref 3.5–5.1)
Sodium: 129 mEq/L — ABNORMAL LOW (ref 135–145)

## 2011-01-29 LAB — CBC
Hemoglobin: 8.7 g/dL — ABNORMAL LOW (ref 13.0–17.0)
MCH: 30.6 pg (ref 26.0–34.0)
MCHC: 34.4 g/dL (ref 30.0–36.0)
MCV: 89.1 fL (ref 78.0–100.0)
Platelets: 25 10*3/uL — CL (ref 150–400)
RBC: 2.84 MIL/uL — ABNORMAL LOW (ref 4.22–5.81)

## 2011-01-29 LAB — POCT CARDIAC MARKERS: Myoglobin, poc: 80.8 ng/mL (ref 12–200)

## 2011-01-29 LAB — DIFFERENTIAL
Basophils Relative: 0 % (ref 0–1)
Eosinophils Absolute: 0.1 10*3/uL (ref 0.0–0.7)
Lymphocytes Relative: 25 % (ref 12–46)
Lymphs Abs: 0.9 10*3/uL (ref 0.7–4.0)
Neutro Abs: 1.8 10*3/uL (ref 1.7–7.7)
Neutrophils Relative %: 55 % (ref 43–77)

## 2011-01-29 LAB — D-DIMER, QUANTITATIVE: D-Dimer, Quant: 1.34 ug/mL-FEU — ABNORMAL HIGH (ref 0.00–0.48)

## 2011-02-15 ENCOUNTER — Encounter (HOSPITAL_BASED_OUTPATIENT_CLINIC_OR_DEPARTMENT_OTHER): Payer: 59 | Admitting: Oncology

## 2011-02-15 ENCOUNTER — Encounter (HOSPITAL_COMMUNITY)
Admission: RE | Admit: 2011-02-15 | Discharge: 2011-02-15 | Disposition: A | Discharge status: Home / Self Care | Payer: 59 | Source: Ambulatory Visit | Attending: Oncology | Admitting: Oncology

## 2011-02-15 ENCOUNTER — Other Ambulatory Visit: Payer: Self-pay | Admitting: Oncology

## 2011-02-15 DIAGNOSIS — D630 Anemia in neoplastic disease: Secondary | ICD-10-CM | POA: Insufficient documentation

## 2011-02-15 DIAGNOSIS — C9000 Multiple myeloma not having achieved remission: Secondary | ICD-10-CM | POA: Insufficient documentation

## 2011-02-15 DIAGNOSIS — D696 Thrombocytopenia, unspecified: Secondary | ICD-10-CM

## 2011-02-15 DIAGNOSIS — E119 Type 2 diabetes mellitus without complications: Secondary | ICD-10-CM

## 2011-02-15 DIAGNOSIS — E876 Hypokalemia: Secondary | ICD-10-CM

## 2011-02-15 DIAGNOSIS — D649 Anemia, unspecified: Secondary | ICD-10-CM

## 2011-02-15 LAB — CBC WITH DIFFERENTIAL/PLATELET
Basophils Absolute: 0 10*3/uL (ref 0.0–0.1)
Eosinophils Absolute: 0.1 10*3/uL (ref 0.0–0.5)
HCT: 18.6 % — ABNORMAL LOW (ref 38.4–49.9)
HGB: 6.2 g/dL — CL (ref 13.0–17.1)
LYMPH%: 33.6 % (ref 14.0–49.0)
MCHC: 33.3 g/dL (ref 32.0–36.0)
MONO#: 0.6 10*3/uL (ref 0.1–0.9)
MONO%: 10.8 % (ref 0.0–14.0)
Platelets: 54 10*3/uL — ABNORMAL LOW (ref 140–400)
WBC: 5.3 10*3/uL (ref 4.0–10.3)
nRBC: 0 % (ref 0–0)

## 2011-02-15 LAB — HOLD TUBE, BLOOD BANK

## 2011-02-15 LAB — COMPREHENSIVE METABOLIC PANEL
ALT: 12 U/L (ref 0–53)
Albumin: 2.1 g/dL — ABNORMAL LOW (ref 3.5–5.2)
CO2: 22 mEq/L (ref 19–32)
Calcium: 8.2 mg/dL — ABNORMAL LOW (ref 8.4–10.5)
Chloride: 103 mEq/L (ref 96–112)
Glucose, Bld: 177 mg/dL — ABNORMAL HIGH (ref 70–99)
Sodium: 133 mEq/L — ABNORMAL LOW (ref 135–145)
Total Bilirubin: 0.7 mg/dL (ref 0.3–1.2)
Total Protein: 10.6 g/dL — ABNORMAL HIGH (ref 6.0–8.3)

## 2011-02-15 LAB — ABO/RH: ABO/RH(D): A POS

## 2011-02-16 ENCOUNTER — Encounter (HOSPITAL_BASED_OUTPATIENT_CLINIC_OR_DEPARTMENT_OTHER): Payer: 59 | Admitting: Oncology

## 2011-02-16 DIAGNOSIS — D649 Anemia, unspecified: Secondary | ICD-10-CM

## 2011-02-18 ENCOUNTER — Encounter (HOSPITAL_BASED_OUTPATIENT_CLINIC_OR_DEPARTMENT_OTHER): Payer: 59 | Admitting: Oncology

## 2011-02-18 DIAGNOSIS — D649 Anemia, unspecified: Secondary | ICD-10-CM

## 2011-02-19 LAB — CROSSMATCH
ABO/RH(D): A POS
Antibody Screen: NEGATIVE
Unit division: 0

## 2011-03-12 ENCOUNTER — Other Ambulatory Visit: Payer: Self-pay | Admitting: Oncology

## 2011-03-12 ENCOUNTER — Encounter (HOSPITAL_BASED_OUTPATIENT_CLINIC_OR_DEPARTMENT_OTHER): Payer: 59 | Admitting: Oncology

## 2011-03-12 DIAGNOSIS — D649 Anemia, unspecified: Secondary | ICD-10-CM

## 2011-03-12 DIAGNOSIS — D696 Thrombocytopenia, unspecified: Secondary | ICD-10-CM

## 2011-03-12 DIAGNOSIS — C9000 Multiple myeloma not having achieved remission: Secondary | ICD-10-CM

## 2011-03-12 DIAGNOSIS — N289 Disorder of kidney and ureter, unspecified: Secondary | ICD-10-CM

## 2011-03-12 DIAGNOSIS — E876 Hypokalemia: Secondary | ICD-10-CM

## 2011-03-12 LAB — TYPE & CROSSMATCH - CHCC

## 2011-03-12 LAB — CBC WITH DIFFERENTIAL/PLATELET
HCT: 16.6 % — ABNORMAL LOW (ref 38.4–49.9)
MCH: 32.9 pg (ref 27.2–33.4)
MCHC: 35.5 g/dL (ref 32.0–36.0)
MONO#: 0.3 10*3/uL (ref 0.1–0.9)
MONO%: 8 % (ref 0.0–14.0)
Platelets: 34 10*3/uL — ABNORMAL LOW (ref 140–400)
RBC: 1.8 10*6/uL — ABNORMAL LOW (ref 4.20–5.82)
WBC: 4.1 10*3/uL (ref 4.0–10.3)

## 2011-03-13 LAB — CROSSMATCH: Unit division: 0

## 2011-03-14 LAB — IGG, IGA, IGM
IgA: 7 mg/dL — ABNORMAL LOW (ref 68–378)
IgG (Immunoglobin G), Serum: 9170 mg/dL — ABNORMAL HIGH (ref 694–1618)
IgM, Serum: 13 mg/dL — ABNORMAL LOW (ref 60–263)

## 2011-03-14 LAB — PROTEIN ELECTROPHORESIS, SERUM
Albumin ELP: 28.9 % — ABNORMAL LOW (ref 55.8–66.1)
Alpha-1-Globulin: 2.8 % — ABNORMAL LOW (ref 2.9–4.9)
Alpha-2-Globulin: 6.5 % — ABNORMAL LOW (ref 7.1–11.8)
Beta Globulin: 2.1 % — ABNORMAL LOW (ref 4.7–7.2)
M-Spike, %: 6.76 g/dL
Total Protein, Serum Electrophoresis: 12.1 g/dL — ABNORMAL HIGH (ref 6.0–8.3)

## 2011-03-14 LAB — COMPREHENSIVE METABOLIC PANEL
Alkaline Phosphatase: 33 U/L — ABNORMAL LOW (ref 39–117)
Creatinine, Ser: 1.5 mg/dL (ref 0.40–1.50)
Glucose, Bld: 181 mg/dL — ABNORMAL HIGH (ref 70–99)
Sodium: 129 mEq/L — ABNORMAL LOW (ref 135–145)
Total Bilirubin: 0.3 mg/dL (ref 0.3–1.2)
Total Protein: 12.1 g/dL — ABNORMAL HIGH (ref 6.0–8.3)

## 2011-03-14 LAB — KAPPA/LAMBDA LIGHT CHAINS
Kappa free light chain: 69.7 mg/dL — ABNORMAL HIGH (ref 0.33–1.94)
Lambda Free Lght Chn: <0.42 mg/dL — ABNORMAL LOW (ref 0.57–2.63)

## 2011-03-14 LAB — MAGNESIUM: Magnesium: 1.6 mg/dL (ref 1.5–2.5)

## 2011-04-01 ENCOUNTER — Encounter (HOSPITAL_BASED_OUTPATIENT_CLINIC_OR_DEPARTMENT_OTHER): Payer: 59 | Admitting: Oncology

## 2011-04-01 ENCOUNTER — Other Ambulatory Visit: Payer: Self-pay | Admitting: Oncology

## 2011-04-01 ENCOUNTER — Ambulatory Visit (HOSPITAL_COMMUNITY)
Admission: RE | Admit: 2011-04-01 | Discharge: 2011-04-01 | Disposition: A | Discharge status: Home / Self Care | Payer: 59 | Source: Ambulatory Visit | Attending: Oncology | Admitting: Oncology

## 2011-04-01 DIAGNOSIS — C9000 Multiple myeloma not having achieved remission: Secondary | ICD-10-CM

## 2011-04-01 DIAGNOSIS — E876 Hypokalemia: Secondary | ICD-10-CM

## 2011-04-01 DIAGNOSIS — M25559 Pain in unspecified hip: Secondary | ICD-10-CM | POA: Insufficient documentation

## 2011-04-01 DIAGNOSIS — D649 Anemia, unspecified: Secondary | ICD-10-CM

## 2011-04-01 DIAGNOSIS — M25552 Pain in left hip: Secondary | ICD-10-CM

## 2011-04-01 DIAGNOSIS — D696 Thrombocytopenia, unspecified: Secondary | ICD-10-CM

## 2011-04-01 LAB — CBC WITH DIFFERENTIAL/PLATELET
BASO%: 0 % (ref 0.0–2.0)
Basophils Absolute: 0 10*3/uL (ref 0.0–0.1)
EOS%: 1.2 % (ref 0.0–7.0)
MCH: 32.8 pg (ref 27.2–33.4)
MCHC: 34.6 g/dL (ref 32.0–36.0)
MCV: 94.8 fL (ref 79.3–98.0)
MONO%: 6.5 % (ref 0.0–14.0)
RBC: 2.17 10*6/uL — ABNORMAL LOW (ref 4.20–5.82)
RDW: 19 % — ABNORMAL HIGH (ref 11.0–14.6)
lymph#: 0.9 10*3/uL (ref 0.9–3.3)

## 2011-04-01 LAB — COMPREHENSIVE METABOLIC PANEL
AST: 11 U/L (ref 0–37)
Albumin: 1.8 g/dL — ABNORMAL LOW (ref 3.5–5.2)
Alkaline Phosphatase: 31 U/L — ABNORMAL LOW (ref 39–117)
Glucose, Bld: 132 mg/dL — ABNORMAL HIGH (ref 70–99)
Potassium: 3.6 mEq/L (ref 3.5–5.3)
Sodium: 127 mEq/L — ABNORMAL LOW (ref 135–145)
Total Bilirubin: 0.4 mg/dL (ref 0.3–1.2)
Total Protein: 11.1 g/dL — ABNORMAL HIGH (ref 6.0–8.3)

## 2011-04-01 LAB — GLUCOSE, CAPILLARY

## 2011-04-03 LAB — CHROMOSOME ANALYSIS, BONE MARROW

## 2011-04-03 LAB — TISSUE HYBRIDIZATION (BONE MARROW)-NCBH

## 2011-04-04 ENCOUNTER — Ambulatory Visit: Payer: 59 | Attending: Radiation Oncology | Admitting: Radiation Oncology

## 2011-04-04 DIAGNOSIS — IMO0002 Reserved for concepts with insufficient information to code with codable children: Secondary | ICD-10-CM | POA: Insufficient documentation

## 2011-04-04 DIAGNOSIS — Y998 Other external cause status: Secondary | ICD-10-CM | POA: Insufficient documentation

## 2011-04-04 DIAGNOSIS — Z79899 Other long term (current) drug therapy: Secondary | ICD-10-CM | POA: Insufficient documentation

## 2011-04-04 DIAGNOSIS — Z510 Encounter for antineoplastic radiation therapy: Secondary | ICD-10-CM | POA: Insufficient documentation

## 2011-04-04 DIAGNOSIS — Z87891 Personal history of nicotine dependence: Secondary | ICD-10-CM | POA: Insufficient documentation

## 2011-04-04 DIAGNOSIS — D696 Thrombocytopenia, unspecified: Secondary | ICD-10-CM | POA: Insufficient documentation

## 2011-04-04 DIAGNOSIS — M25559 Pain in unspecified hip: Secondary | ICD-10-CM | POA: Insufficient documentation

## 2011-04-04 DIAGNOSIS — Y929 Unspecified place or not applicable: Secondary | ICD-10-CM | POA: Insufficient documentation

## 2011-04-04 DIAGNOSIS — X58XXXA Exposure to other specified factors, initial encounter: Secondary | ICD-10-CM | POA: Insufficient documentation

## 2011-04-04 DIAGNOSIS — R29898 Other symptoms and signs involving the musculoskeletal system: Secondary | ICD-10-CM | POA: Insufficient documentation

## 2011-04-04 DIAGNOSIS — E119 Type 2 diabetes mellitus without complications: Secondary | ICD-10-CM | POA: Insufficient documentation

## 2011-04-04 DIAGNOSIS — C9000 Multiple myeloma not having achieved remission: Secondary | ICD-10-CM | POA: Insufficient documentation

## 2011-04-04 DIAGNOSIS — M76899 Other specified enthesopathies of unspecified lower limb, excluding foot: Secondary | ICD-10-CM | POA: Insufficient documentation

## 2011-04-05 LAB — DIFFERENTIAL
Basophils Absolute: 0 10*3/uL (ref 0.0–0.1)
Lymphocytes Relative: 47 % — ABNORMAL HIGH (ref 12–46)
Lymphs Abs: 2.3 10*3/uL (ref 0.7–4.0)
Neutro Abs: 1.6 10*3/uL — ABNORMAL LOW (ref 1.7–7.7)

## 2011-04-05 LAB — TISSUE HYBRIDIZATION (BONE MARROW)-NCBH

## 2011-04-05 LAB — CBC
Hemoglobin: 12.6 g/dL — ABNORMAL LOW (ref 13.0–17.0)
Platelets: 198 10*3/uL (ref 150–400)
RDW: 14 % (ref 11.5–15.5)
WBC: 5.1 10*3/uL (ref 4.0–10.5)

## 2011-04-05 LAB — CHROMOSOME ANALYSIS, BONE MARROW

## 2011-04-05 LAB — BONE MARROW EXAM

## 2011-04-05 LAB — GLUCOSE, CAPILLARY: Glucose-Capillary: 121 mg/dL — ABNORMAL HIGH (ref 70–99)

## 2011-04-06 ENCOUNTER — Ambulatory Visit (HOSPITAL_COMMUNITY)
Admission: RE | Admit: 2011-04-06 | Discharge: 2011-04-06 | Disposition: A | Discharge status: Home / Self Care | Payer: 59 | Source: Ambulatory Visit | Attending: Oncology | Admitting: Oncology

## 2011-04-06 DIAGNOSIS — IMO0002 Reserved for concepts with insufficient information to code with codable children: Secondary | ICD-10-CM | POA: Insufficient documentation

## 2011-04-06 DIAGNOSIS — C9000 Multiple myeloma not having achieved remission: Secondary | ICD-10-CM | POA: Insufficient documentation

## 2011-04-06 DIAGNOSIS — M25552 Pain in left hip: Secondary | ICD-10-CM

## 2011-04-06 DIAGNOSIS — M79609 Pain in unspecified limb: Secondary | ICD-10-CM | POA: Insufficient documentation

## 2011-04-06 DIAGNOSIS — M25559 Pain in unspecified hip: Secondary | ICD-10-CM | POA: Insufficient documentation

## 2011-04-06 DIAGNOSIS — M8448XA Pathological fracture, other site, initial encounter for fracture: Secondary | ICD-10-CM | POA: Insufficient documentation

## 2011-04-06 DIAGNOSIS — X58XXXA Exposure to other specified factors, initial encounter: Secondary | ICD-10-CM | POA: Insufficient documentation

## 2011-04-06 MED ORDER — GADOBENATE DIMEGLUMINE 529 MG/ML IV SOLN
20.0000 mL | Freq: Once | INTRAVENOUS | Status: AC | PRN
  Administered 2011-04-06: 20 mL via INTRAVENOUS

## 2011-04-08 ENCOUNTER — Other Ambulatory Visit: Payer: Self-pay | Admitting: Oncology

## 2011-04-08 ENCOUNTER — Encounter (HOSPITAL_BASED_OUTPATIENT_CLINIC_OR_DEPARTMENT_OTHER): Payer: 59 | Admitting: Oncology

## 2011-04-08 ENCOUNTER — Encounter (HOSPITAL_COMMUNITY)
Admission: RE | Admit: 2011-04-08 | Discharge: 2011-04-08 | Disposition: A | Discharge status: Home / Self Care | Payer: 59 | Source: Ambulatory Visit | Attending: Oncology | Admitting: Oncology

## 2011-04-08 DIAGNOSIS — N289 Disorder of kidney and ureter, unspecified: Secondary | ICD-10-CM

## 2011-04-08 DIAGNOSIS — D630 Anemia in neoplastic disease: Secondary | ICD-10-CM | POA: Insufficient documentation

## 2011-04-08 DIAGNOSIS — C9000 Multiple myeloma not having achieved remission: Secondary | ICD-10-CM

## 2011-04-08 DIAGNOSIS — D649 Anemia, unspecified: Secondary | ICD-10-CM

## 2011-04-08 LAB — CBC WITH DIFFERENTIAL/PLATELET
BASO%: 0.2 % (ref 0.0–2.0)
LYMPH%: 31.7 % (ref 14.0–49.0)
MCHC: 32.7 g/dL (ref 32.0–36.0)
MONO#: 0.4 10*3/uL (ref 0.1–0.9)
RBC: 2.23 10*6/uL — ABNORMAL LOW (ref 4.20–5.82)
RDW: 17.9 % — ABNORMAL HIGH (ref 11.0–14.6)
WBC: 4.3 10*3/uL (ref 4.0–10.3)
lymph#: 1.4 10*3/uL (ref 0.9–3.3)
nRBC: 0 % (ref 0–0)

## 2011-04-08 LAB — PROTIME-INR: Protime: 15.6 Seconds — ABNORMAL HIGH (ref 10.6–13.4)

## 2011-04-09 LAB — BONE MARROW EXAM

## 2011-04-09 LAB — CROSSMATCH
ABO/RH(D): A POS
Antibody Screen: NEGATIVE
Unit division: 0

## 2011-04-10 ENCOUNTER — Encounter (HOSPITAL_BASED_OUTPATIENT_CLINIC_OR_DEPARTMENT_OTHER): Payer: 59 | Admitting: Oncology

## 2011-04-10 ENCOUNTER — Other Ambulatory Visit: Payer: Self-pay | Admitting: Oncology

## 2011-04-10 DIAGNOSIS — N289 Disorder of kidney and ureter, unspecified: Secondary | ICD-10-CM

## 2011-04-10 DIAGNOSIS — D649 Anemia, unspecified: Secondary | ICD-10-CM

## 2011-04-10 DIAGNOSIS — C9000 Multiple myeloma not having achieved remission: Secondary | ICD-10-CM

## 2011-04-10 LAB — CBC WITH DIFFERENTIAL/PLATELET
Basophils Absolute: 0 10*3/uL (ref 0.0–0.1)
EOS%: 1.4 % (ref 0.0–7.0)
HCT: 24.9 % — ABNORMAL LOW (ref 38.4–49.9)
HGB: 8 g/dL — ABNORMAL LOW (ref 13.0–17.1)
LYMPH%: 37 % (ref 14.0–49.0)
MCH: 30.1 pg (ref 27.2–33.4)
MCV: 93.6 fL (ref 79.3–98.0)
MONO%: 10.9 % (ref 0.0–14.0)
NEUT%: 50.5 % (ref 39.0–75.0)
Platelets: 23 10*3/uL — ABNORMAL LOW (ref 140–400)

## 2011-04-11 LAB — PREPARE PLATELETS: Unit division: 0

## 2011-04-12 ENCOUNTER — Encounter (HOSPITAL_COMMUNITY): Payer: 59

## 2011-04-16 ENCOUNTER — Encounter (HOSPITAL_COMMUNITY): Payer: 59

## 2011-04-22 ENCOUNTER — Other Ambulatory Visit: Payer: Self-pay | Admitting: Oncology

## 2011-04-22 ENCOUNTER — Encounter (HOSPITAL_COMMUNITY): Payer: 59

## 2011-04-22 ENCOUNTER — Encounter (HOSPITAL_BASED_OUTPATIENT_CLINIC_OR_DEPARTMENT_OTHER): Payer: 59 | Admitting: Oncology

## 2011-04-22 DIAGNOSIS — N289 Disorder of kidney and ureter, unspecified: Secondary | ICD-10-CM

## 2011-04-22 DIAGNOSIS — D61818 Other pancytopenia: Secondary | ICD-10-CM

## 2011-04-22 DIAGNOSIS — D649 Anemia, unspecified: Secondary | ICD-10-CM

## 2011-04-22 DIAGNOSIS — C9000 Multiple myeloma not having achieved remission: Secondary | ICD-10-CM

## 2011-04-22 LAB — CBC WITH DIFFERENTIAL/PLATELET
Basophils Absolute: 0 10*3/uL (ref 0.0–0.1)
EOS%: 3.1 % (ref 0.0–7.0)
Eosinophils Absolute: 0.1 10*3/uL (ref 0.0–0.5)
HCT: 17.4 % — ABNORMAL LOW (ref 38.4–49.9)
HGB: 6 g/dL — CL (ref 13.0–17.1)
MCH: 31.9 pg (ref 27.2–33.4)
MCV: 92 fL (ref 79.3–98.0)
MONO%: 12.8 % (ref 0.0–14.0)
NEUT%: 66.3 % (ref 39.0–75.0)

## 2011-04-22 LAB — COMPREHENSIVE METABOLIC PANEL
AST: 14 U/L (ref 0–37)
BUN: 8 mg/dL (ref 6–23)
Calcium: 8.2 mg/dL — ABNORMAL LOW (ref 8.4–10.5)
Chloride: 101 mEq/L (ref 96–112)
Creatinine, Ser: 0.99 mg/dL (ref 0.40–1.50)
Glucose, Bld: 111 mg/dL — ABNORMAL HIGH (ref 70–99)

## 2011-04-23 LAB — CROSSMATCH
Antibody Screen: NEGATIVE
Unit division: 0

## 2011-05-01 ENCOUNTER — Encounter (HOSPITAL_BASED_OUTPATIENT_CLINIC_OR_DEPARTMENT_OTHER): Payer: 59 | Admitting: Oncology

## 2011-05-01 ENCOUNTER — Other Ambulatory Visit: Payer: Self-pay | Admitting: Oncology

## 2011-05-01 ENCOUNTER — Other Ambulatory Visit: Payer: Self-pay | Admitting: Medical

## 2011-05-01 DIAGNOSIS — N289 Disorder of kidney and ureter, unspecified: Secondary | ICD-10-CM

## 2011-05-01 DIAGNOSIS — C9000 Multiple myeloma not having achieved remission: Secondary | ICD-10-CM

## 2011-05-01 DIAGNOSIS — D6489 Other specified anemias: Secondary | ICD-10-CM

## 2011-05-01 DIAGNOSIS — D649 Anemia, unspecified: Secondary | ICD-10-CM

## 2011-05-01 LAB — CBC WITH DIFFERENTIAL/PLATELET
Basophils Absolute: 0 10*3/uL (ref 0.0–0.1)
Eosinophils Absolute: 0.1 10*3/uL (ref 0.0–0.5)
HCT: 18.9 % — ABNORMAL LOW (ref 38.4–49.9)
HGB: 6.2 g/dL — CL (ref 13.0–17.1)
MCH: 30 pg (ref 27.2–33.4)
MONO#: 0.7 10*3/uL (ref 0.1–0.9)
NEUT#: 1.7 10*3/uL (ref 1.5–6.5)
NEUT%: 47.4 % (ref 39.0–75.0)
RDW: 17.4 % — ABNORMAL HIGH (ref 11.0–14.6)
lymph#: 1.2 10*3/uL (ref 0.9–3.3)

## 2011-05-03 ENCOUNTER — Encounter (HOSPITAL_BASED_OUTPATIENT_CLINIC_OR_DEPARTMENT_OTHER): Payer: 59 | Admitting: Oncology

## 2011-05-03 DIAGNOSIS — D649 Anemia, unspecified: Secondary | ICD-10-CM

## 2011-05-03 DIAGNOSIS — N289 Disorder of kidney and ureter, unspecified: Secondary | ICD-10-CM

## 2011-05-04 LAB — CROSSMATCH
ABO/RH(D): A POS
Antibody Screen: NEGATIVE
Unit division: 0

## 2011-05-14 NOTE — Procedures (Signed)
NAME:  Jason Rios, Jason Rios              ACCOUNT NO.:  [id number]   MEDICAL RECORD NO.:  [id number]          PATIENT TYPE:  OUT   LOCATION:  SLEEP CENTER                 FACILITY:  [facility]   PHYSICIAN:  Clinton D. Maple Hudson, MD, FCCP, FACPDATE OF BIRTH:  11-29-1952   DATE OF STUDY:  08/14/2008                            NOCTURNAL POLYSOMNOGRAM   REFERRING PHYSICIAN:  Antony Contras, MD   INDICATION FOR STUDY:  Hypersomnia with sleep apnea.   EPWORTH SLEEPINESS SCORE:  14/24.   BMI:  41.   WEIGHT:  274 pounds.   HEIGHT:  58 inches.   NECK:  19 inches.   MEDICATIONS:  Home medications charted and reviewed.   SLEEP ARCHITECTURE:  Split study protocol.  During the diagnostic phase,  total sleep time was 119.5 minutes with sleep efficiency 93%.  Stage 1  was 6.3%.  Stage 2 was 89.1%.  Stage 3 absent.  REM 4.6% of total sleep  time.  Sleep latency 3.5 minutes.  REM latency 52.5 minutes.  Awake  after sleep onset 6 minutes.  Arousal index 26.6.  No bedtime medication  was taken.   RESPIRATORY DATA:  Split study protocol.  Apnea/hypopnea index (AHI)  62.3 per hour.  There were 124 events scored, including 47 obstructive  apneas, 2 central apneas, 1 mixed apnea and 74 hypopneas before CPAP.  Events were not positional.  CPAP was titrated to 18 CWP, AHI 0 per  hour.  He chose a medium Mirage Quattro mask with heated humidifier.   OXYGEN DATA:  Loud snoring with oxygen desaturation to a nadir of 61%  before CPAP.  After CPAP control, mean oxygen saturation held 93.7% on  room air.   CARDIAC DATA:  Sinus rhythm with PVCs and PACs.   MOVEMENT/PARASOMNIA:  No significant movement disturbance.  Bathroom x1.   IMPRESSIONS/RECOMMENDATIONS:  1. Severe obstructive sleep apnea/hypopnea syndrome, AHI 62.3 per hour      with nonpositional events, loud snoring, and oxygen desaturation to      a nadir of 61%.  2. Successful CPAP titration to 18 CWP, AHI 0 per hour.  He chose a      medium Mirage  Quattro mask with heated humidifier.      Clinton D. Maple Hudson, MD, [facility], FACP  Diplomate, Biomedical engineer of Sleep Medicine  Electronically Signed     CDY/MEDQ  D:  08/20/2008 11:46:58  T:  08/20/2008 04:54:09  Job:  [id number]

## 2011-05-20 ENCOUNTER — Other Ambulatory Visit: Payer: Self-pay | Admitting: Oncology

## 2011-05-20 ENCOUNTER — Encounter (HOSPITAL_COMMUNITY)
Admission: RE | Admit: 2011-05-20 | Discharge: 2011-05-20 | Disposition: A | Discharge status: Home / Self Care | Payer: 59 | Source: Ambulatory Visit | Attending: Oncology | Admitting: Oncology

## 2011-05-20 ENCOUNTER — Encounter (HOSPITAL_BASED_OUTPATIENT_CLINIC_OR_DEPARTMENT_OTHER): Payer: 59 | Admitting: Oncology

## 2011-05-20 DIAGNOSIS — C9000 Multiple myeloma not having achieved remission: Secondary | ICD-10-CM | POA: Insufficient documentation

## 2011-05-20 DIAGNOSIS — N289 Disorder of kidney and ureter, unspecified: Secondary | ICD-10-CM

## 2011-05-20 DIAGNOSIS — D61818 Other pancytopenia: Secondary | ICD-10-CM

## 2011-05-20 DIAGNOSIS — D649 Anemia, unspecified: Secondary | ICD-10-CM

## 2011-05-20 DIAGNOSIS — D630 Anemia in neoplastic disease: Secondary | ICD-10-CM | POA: Insufficient documentation

## 2011-05-20 LAB — CBC WITH DIFFERENTIAL/PLATELET
Basophils Absolute: 0 10*3/uL (ref 0.0–0.1)
EOS%: 2.4 % (ref 0.0–7.0)
Eosinophils Absolute: 0.1 10*3/uL (ref 0.0–0.5)
HCT: 18.4 % — ABNORMAL LOW (ref 38.4–49.9)
HGB: 6.5 g/dL — CL (ref 13.0–17.1)
MCH: 31.4 pg (ref 27.2–33.4)
MCV: 88.9 fL (ref 79.3–98.0)
MONO%: 10.7 % (ref 0.0–14.0)
NEUT#: 1.4 10*3/uL — ABNORMAL LOW (ref 1.5–6.5)
NEUT%: 51.4 % (ref 39.0–75.0)
RDW: 17.2 % — ABNORMAL HIGH (ref 11.0–14.6)

## 2011-05-20 LAB — TYPE & CROSSMATCH - CHCC

## 2011-05-21 LAB — CROSSMATCH
ABO/RH(D): A POS
Antibody Screen: NEGATIVE
Unit division: 0
Unit division: 0

## 2011-05-23 ENCOUNTER — Ambulatory Visit: Payer: 59 | Admitting: Radiation Oncology

## 2011-05-23 ENCOUNTER — Ambulatory Visit
Admission: RE | Admit: 2011-05-23 | Discharge: 2011-05-23 | Disposition: A | Discharge status: Home / Self Care | Payer: 59 | Source: Ambulatory Visit | Attending: Radiation Oncology | Admitting: Radiation Oncology

## 2011-05-24 ENCOUNTER — Encounter (HOSPITAL_COMMUNITY): Payer: 59

## 2011-05-28 ENCOUNTER — Encounter (HOSPITAL_COMMUNITY): Payer: 59

## 2011-05-30 ENCOUNTER — Other Ambulatory Visit: Payer: Self-pay | Admitting: Oncology

## 2011-05-30 ENCOUNTER — Encounter (HOSPITAL_BASED_OUTPATIENT_CLINIC_OR_DEPARTMENT_OTHER): Payer: 59 | Admitting: Oncology

## 2011-05-30 DIAGNOSIS — D649 Anemia, unspecified: Secondary | ICD-10-CM

## 2011-05-30 DIAGNOSIS — D61818 Other pancytopenia: Secondary | ICD-10-CM

## 2011-05-30 DIAGNOSIS — C9000 Multiple myeloma not having achieved remission: Secondary | ICD-10-CM

## 2011-05-30 DIAGNOSIS — N289 Disorder of kidney and ureter, unspecified: Secondary | ICD-10-CM

## 2011-05-30 LAB — CBC WITH DIFFERENTIAL/PLATELET
BASO%: 0 % (ref 0.0–2.0)
EOS%: 1.2 % (ref 0.0–7.0)
MCH: 28.7 pg (ref 27.2–33.4)
MCHC: 32.9 g/dL (ref 32.0–36.0)
MONO#: 0.6 10*3/uL (ref 0.1–0.9)
RDW: 16 % — ABNORMAL HIGH (ref 11.0–14.6)
WBC: 5.1 10*3/uL (ref 4.0–10.3)
lymph#: 2.3 10*3/uL (ref 0.9–3.3)
nRBC: 0 % (ref 0–0)

## 2011-05-30 LAB — HOLD TUBE, BLOOD BANK

## 2011-05-31 ENCOUNTER — Encounter (HOSPITAL_BASED_OUTPATIENT_CLINIC_OR_DEPARTMENT_OTHER): Payer: 59 | Admitting: Oncology

## 2011-05-31 DIAGNOSIS — N289 Disorder of kidney and ureter, unspecified: Secondary | ICD-10-CM

## 2011-05-31 DIAGNOSIS — D649 Anemia, unspecified: Secondary | ICD-10-CM

## 2011-06-01 LAB — PREPARE PLATELET PHERESIS: Unit division: 0

## 2011-06-03 ENCOUNTER — Other Ambulatory Visit: Payer: Self-pay | Admitting: Medical

## 2011-06-03 ENCOUNTER — Encounter (HOSPITAL_BASED_OUTPATIENT_CLINIC_OR_DEPARTMENT_OTHER): Payer: 59 | Admitting: Oncology

## 2011-06-03 ENCOUNTER — Encounter (HOSPITAL_COMMUNITY): Payer: 59

## 2011-06-03 ENCOUNTER — Other Ambulatory Visit: Payer: Self-pay | Admitting: Oncology

## 2011-06-03 DIAGNOSIS — N289 Disorder of kidney and ureter, unspecified: Secondary | ICD-10-CM

## 2011-06-03 DIAGNOSIS — D630 Anemia in neoplastic disease: Secondary | ICD-10-CM | POA: Insufficient documentation

## 2011-06-03 DIAGNOSIS — D649 Anemia, unspecified: Secondary | ICD-10-CM

## 2011-06-03 DIAGNOSIS — D61818 Other pancytopenia: Secondary | ICD-10-CM

## 2011-06-03 DIAGNOSIS — C9000 Multiple myeloma not having achieved remission: Secondary | ICD-10-CM | POA: Insufficient documentation

## 2011-06-03 LAB — CBC WITH DIFFERENTIAL/PLATELET
Eosinophils Absolute: 0 10*3/uL (ref 0.0–0.5)
HCT: 20.8 % — ABNORMAL LOW (ref 38.4–49.9)
LYMPH%: 30.5 % (ref 14.0–49.0)
MCV: 87.8 fL (ref 79.3–98.0)
MONO#: 0.6 10*3/uL (ref 0.1–0.9)
MONO%: 13.4 % (ref 0.0–14.0)
NEUT#: 2.4 10*3/uL (ref 1.5–6.5)
NEUT%: 54.5 % (ref 39.0–75.0)
Platelets: 22 10*3/uL — ABNORMAL LOW (ref 140–400)
WBC: 4.4 10*3/uL (ref 4.0–10.3)

## 2011-06-03 LAB — COMPREHENSIVE METABOLIC PANEL
ALT: <8 U/L (ref 0–53)
AST: 10 U/L (ref 0–37)
Albumin: 2 g/dL — ABNORMAL LOW (ref 3.5–5.2)
Alkaline Phosphatase: 42 U/L (ref 39–117)
BUN: 24 mg/dL — ABNORMAL HIGH (ref 6–23)
Potassium: 4.3 mEq/L (ref 3.5–5.3)
Sodium: 125 mEq/L — ABNORMAL LOW (ref 135–145)
Total Protein: 13.7 g/dL — ABNORMAL HIGH (ref 6.0–8.3)

## 2011-06-03 LAB — TYPE & CROSSMATCH - CHCC

## 2011-06-04 LAB — CROSSMATCH: Unit division: 0

## 2011-06-05 ENCOUNTER — Other Ambulatory Visit: Payer: Self-pay | Admitting: Oncology

## 2011-06-05 LAB — BASIC METABOLIC PANEL
BUN: 22 mg/dL (ref 6–23)
Calcium: 9.4 mg/dL (ref 8.4–10.5)
Chloride: 94 mEq/L — ABNORMAL LOW (ref 96–112)
Creatinine, Ser: 1.38 mg/dL — ABNORMAL HIGH (ref 0.50–1.35)

## 2011-06-07 ENCOUNTER — Ambulatory Visit (HOSPITAL_COMMUNITY)
Admission: RE | Admit: 2011-06-07 | Discharge: 2011-06-07 | Disposition: A | Discharge status: Home / Self Care | Payer: 59 | Source: Ambulatory Visit | Attending: Radiation Oncology | Admitting: Radiation Oncology

## 2011-06-07 ENCOUNTER — Ambulatory Visit (HOSPITAL_COMMUNITY): Payer: 59

## 2011-06-07 ENCOUNTER — Encounter (HOSPITAL_COMMUNITY): Payer: 59

## 2011-06-07 ENCOUNTER — Other Ambulatory Visit: Payer: Self-pay | Admitting: Radiation Oncology

## 2011-06-07 ENCOUNTER — Ambulatory Visit
Admission: RE | Admit: 2011-06-07 | Discharge: 2011-06-07 | Disposition: A | Discharge status: Home / Self Care | Payer: 59 | Source: Ambulatory Visit | Attending: Radiation Oncology | Admitting: Radiation Oncology

## 2011-06-07 DIAGNOSIS — M25559 Pain in unspecified hip: Secondary | ICD-10-CM | POA: Insufficient documentation

## 2011-06-07 DIAGNOSIS — C9000 Multiple myeloma not having achieved remission: Secondary | ICD-10-CM | POA: Insufficient documentation

## 2011-06-13 ENCOUNTER — Ambulatory Visit: Payer: 59 | Admitting: Radiation Oncology

## 2011-06-17 ENCOUNTER — Encounter (HOSPITAL_BASED_OUTPATIENT_CLINIC_OR_DEPARTMENT_OTHER): Payer: 59 | Admitting: Oncology

## 2011-06-17 ENCOUNTER — Other Ambulatory Visit: Payer: Self-pay | Admitting: Oncology

## 2011-06-17 DIAGNOSIS — D61818 Other pancytopenia: Secondary | ICD-10-CM

## 2011-06-17 DIAGNOSIS — N289 Disorder of kidney and ureter, unspecified: Secondary | ICD-10-CM

## 2011-06-17 DIAGNOSIS — C9000 Multiple myeloma not having achieved remission: Secondary | ICD-10-CM

## 2011-06-17 DIAGNOSIS — D649 Anemia, unspecified: Secondary | ICD-10-CM

## 2011-06-17 LAB — CBC WITH DIFFERENTIAL/PLATELET
EOS%: 1.5 % (ref 0.0–7.0)
Eosinophils Absolute: 0.1 10*3/uL (ref 0.0–0.5)
MCV: 88.6 fL (ref 79.3–98.0)
MONO%: 10.9 % (ref 0.0–14.0)
NEUT#: 1.9 10*3/uL (ref 1.5–6.5)
RBC: 2.34 10*6/uL — ABNORMAL LOW (ref 4.20–5.82)
RDW: 15.8 % — ABNORMAL HIGH (ref 11.0–14.6)
lymph#: 1.2 10*3/uL (ref 0.9–3.3)

## 2011-06-18 LAB — PREPARE PLATELET PHERESIS

## 2011-06-24 ENCOUNTER — Other Ambulatory Visit: Payer: Self-pay | Admitting: Medical

## 2011-06-24 ENCOUNTER — Encounter (HOSPITAL_BASED_OUTPATIENT_CLINIC_OR_DEPARTMENT_OTHER): Payer: 59 | Admitting: Oncology

## 2011-06-24 ENCOUNTER — Encounter (HOSPITAL_COMMUNITY)
Admission: RE | Admit: 2011-06-24 | Discharge: 2011-06-24 | Disposition: A | Discharge status: Home / Self Care | Payer: 59 | Source: Ambulatory Visit | Attending: Oncology | Admitting: Oncology

## 2011-06-24 ENCOUNTER — Other Ambulatory Visit: Payer: Self-pay | Admitting: Oncology

## 2011-06-24 DIAGNOSIS — C9000 Multiple myeloma not having achieved remission: Secondary | ICD-10-CM

## 2011-06-24 DIAGNOSIS — D61818 Other pancytopenia: Secondary | ICD-10-CM

## 2011-06-24 LAB — CBC WITH DIFFERENTIAL/PLATELET
Basophils Absolute: 0 10*3/uL (ref 0.0–0.1)
EOS%: 1.6 % (ref 0.0–7.0)
HCT: 20.1 % — ABNORMAL LOW (ref 38.4–49.9)
HGB: 6.6 g/dL — CL (ref 13.0–17.1)
LYMPH%: 42.1 % (ref 14.0–49.0)
MCH: 29.2 pg (ref 27.2–33.4)
MCV: 88.9 fL (ref 79.3–98.0)
MONO%: 11.2 % (ref 0.0–14.0)
NEUT%: 45.1 % (ref 39.0–75.0)

## 2011-06-24 LAB — TYPE & CROSSMATCH - CHCC

## 2011-06-25 LAB — CROSSMATCH: Unit division: 0

## 2011-06-28 ENCOUNTER — Encounter (HOSPITAL_COMMUNITY): Payer: 59

## 2011-07-01 ENCOUNTER — Other Ambulatory Visit: Payer: Self-pay | Admitting: Oncology

## 2011-07-01 ENCOUNTER — Encounter (HOSPITAL_BASED_OUTPATIENT_CLINIC_OR_DEPARTMENT_OTHER): Payer: 59 | Admitting: Oncology

## 2011-07-01 DIAGNOSIS — D649 Anemia, unspecified: Secondary | ICD-10-CM

## 2011-07-01 DIAGNOSIS — N289 Disorder of kidney and ureter, unspecified: Secondary | ICD-10-CM

## 2011-07-01 DIAGNOSIS — C9000 Multiple myeloma not having achieved remission: Secondary | ICD-10-CM

## 2011-07-01 LAB — CBC WITH DIFFERENTIAL/PLATELET
Basophils Absolute: 0 10*3/uL (ref 0.0–0.1)
EOS%: 1.5 % (ref 0.0–7.0)
HGB: 8 g/dL — ABNORMAL LOW (ref 13.0–17.1)
MCH: 30.8 pg (ref 27.2–33.4)
NEUT#: 1.2 10*3/uL — ABNORMAL LOW (ref 1.5–6.5)
RDW: 15.9 % — ABNORMAL HIGH (ref 11.0–14.6)
lymph#: 1.2 10*3/uL (ref 0.9–3.3)

## 2011-07-01 LAB — HOLD TUBE, BLOOD BANK

## 2011-07-02 ENCOUNTER — Encounter (HOSPITAL_COMMUNITY): Payer: Medicare Other

## 2011-07-02 DIAGNOSIS — C9000 Multiple myeloma not having achieved remission: Secondary | ICD-10-CM | POA: Insufficient documentation

## 2011-07-02 DIAGNOSIS — D630 Anemia in neoplastic disease: Secondary | ICD-10-CM | POA: Insufficient documentation

## 2011-07-04 ENCOUNTER — Ambulatory Visit
Admission: RE | Admit: 2011-07-04 | Discharge: 2011-07-04 | Disposition: A | Discharge status: Home / Self Care | Payer: Medicare Other | Source: Ambulatory Visit | Attending: Radiation Oncology | Admitting: Radiation Oncology

## 2011-07-08 ENCOUNTER — Encounter (HOSPITAL_COMMUNITY): Payer: Medicare Other

## 2011-07-15 ENCOUNTER — Encounter (HOSPITAL_BASED_OUTPATIENT_CLINIC_OR_DEPARTMENT_OTHER): Payer: Medicare Other | Admitting: Oncology

## 2011-07-15 ENCOUNTER — Other Ambulatory Visit: Payer: Self-pay | Admitting: Oncology

## 2011-07-15 DIAGNOSIS — C9000 Multiple myeloma not having achieved remission: Secondary | ICD-10-CM

## 2011-07-15 DIAGNOSIS — N289 Disorder of kidney and ureter, unspecified: Secondary | ICD-10-CM

## 2011-07-15 DIAGNOSIS — D709 Neutropenia, unspecified: Secondary | ICD-10-CM

## 2011-07-15 DIAGNOSIS — D696 Thrombocytopenia, unspecified: Secondary | ICD-10-CM

## 2011-07-15 DIAGNOSIS — D649 Anemia, unspecified: Secondary | ICD-10-CM

## 2011-07-15 DIAGNOSIS — C9002 Multiple myeloma in relapse: Secondary | ICD-10-CM

## 2011-07-15 LAB — COMPREHENSIVE METABOLIC PANEL
ALT: <8 U/L (ref 0–53)
AST: 13 U/L (ref 0–37)
CO2: 21 mEq/L (ref 19–32)
Calcium: 6.8 mg/dL — ABNORMAL LOW (ref 8.4–10.5)
Chloride: 100 mEq/L (ref 96–112)
Potassium: 3.9 mEq/L (ref 3.5–5.3)
Sodium: 123 mEq/L — ABNORMAL LOW (ref 135–145)
Total Protein: 12.7 g/dL — ABNORMAL HIGH (ref 6.0–8.3)

## 2011-07-15 LAB — CBC WITH DIFFERENTIAL/PLATELET
Eosinophils Absolute: 0.1 10*3/uL (ref 0.0–0.5)
LYMPH%: 40.8 % (ref 14.0–49.0)
MONO#: 0.3 10*3/uL (ref 0.1–0.9)
NEUT#: 1.2 10*3/uL — ABNORMAL LOW (ref 1.5–6.5)
Platelets: 19 10*3/uL — ABNORMAL LOW (ref 140–400)
RBC: 2.18 10*6/uL — ABNORMAL LOW (ref 4.20–5.82)
RDW: 19.9 % — ABNORMAL HIGH (ref 11.0–14.6)
WBC: 2.6 10*3/uL — ABNORMAL LOW (ref 4.0–10.3)
lymph#: 1.1 10*3/uL (ref 0.9–3.3)
nRBC: 0 % (ref 0–0)

## 2011-07-15 LAB — HOLD TUBE, BLOOD BANK

## 2011-07-15 LAB — TYPE & CROSSMATCH - CHCC

## 2011-07-16 LAB — CROSSMATCH
ABO/RH(D): A POS
Antibody Screen: NEGATIVE

## 2011-07-19 ENCOUNTER — Other Ambulatory Visit: Payer: Self-pay | Admitting: Oncology

## 2011-07-19 ENCOUNTER — Ambulatory Visit (HOSPITAL_COMMUNITY)
Admission: RE | Admit: 2011-07-19 | Discharge: 2011-07-19 | Disposition: A | Discharge status: Home / Self Care | Payer: Medicare Other | Source: Ambulatory Visit | Attending: Oncology | Admitting: Oncology

## 2011-07-19 DIAGNOSIS — I517 Cardiomegaly: Secondary | ICD-10-CM | POA: Insufficient documentation

## 2011-07-19 DIAGNOSIS — R05 Cough: Secondary | ICD-10-CM | POA: Insufficient documentation

## 2011-07-19 DIAGNOSIS — R0602 Shortness of breath: Secondary | ICD-10-CM | POA: Insufficient documentation

## 2011-07-19 DIAGNOSIS — J3489 Other specified disorders of nose and nasal sinuses: Secondary | ICD-10-CM | POA: Insufficient documentation

## 2011-07-19 DIAGNOSIS — R059 Cough, unspecified: Secondary | ICD-10-CM | POA: Insufficient documentation

## 2011-07-19 DIAGNOSIS — Z87898 Personal history of other specified conditions: Secondary | ICD-10-CM | POA: Insufficient documentation

## 2011-07-22 ENCOUNTER — Encounter (HOSPITAL_BASED_OUTPATIENT_CLINIC_OR_DEPARTMENT_OTHER): Payer: Medicare Other | Admitting: Oncology

## 2011-07-22 ENCOUNTER — Other Ambulatory Visit: Payer: Self-pay | Admitting: Oncology

## 2011-07-22 DIAGNOSIS — D61818 Other pancytopenia: Secondary | ICD-10-CM

## 2011-07-22 DIAGNOSIS — C9000 Multiple myeloma not having achieved remission: Secondary | ICD-10-CM

## 2011-07-22 LAB — HOLD TUBE, BLOOD BANK

## 2011-07-22 LAB — CBC WITH DIFFERENTIAL/PLATELET
BASO%: 0.1 % (ref 0.0–2.0)
EOS%: 2.5 % (ref 0.0–7.0)
LYMPH%: 42.4 % (ref 14.0–49.0)
MCH: 32.1 pg (ref 27.2–33.4)
MCHC: 34.7 g/dL (ref 32.0–36.0)
MONO#: 0.3 10*3/uL (ref 0.1–0.9)
NEUT%: 43 % (ref 39.0–75.0)
Platelets: 20 10*3/uL — ABNORMAL LOW (ref 140–400)
RBC: 2.33 10*6/uL — ABNORMAL LOW (ref 4.20–5.82)
WBC: 2.6 10*3/uL — ABNORMAL LOW (ref 4.0–10.3)
lymph#: 1.1 10*3/uL (ref 0.9–3.3)

## 2011-07-22 LAB — SAMPLE TO BLOOD BANK

## 2011-07-29 ENCOUNTER — Other Ambulatory Visit: Payer: Self-pay | Admitting: Oncology

## 2011-07-29 ENCOUNTER — Encounter (HOSPITAL_BASED_OUTPATIENT_CLINIC_OR_DEPARTMENT_OTHER): Payer: Medicare Other | Admitting: Oncology

## 2011-07-29 ENCOUNTER — Encounter (HOSPITAL_COMMUNITY)
Admission: RE | Admit: 2011-07-29 | Discharge: 2011-07-29 | Disposition: A | Discharge status: Home / Self Care | Payer: Medicare Other | Source: Ambulatory Visit | Attending: Oncology | Admitting: Oncology

## 2011-07-29 DIAGNOSIS — D61818 Other pancytopenia: Secondary | ICD-10-CM

## 2011-07-29 DIAGNOSIS — C9000 Multiple myeloma not having achieved remission: Secondary | ICD-10-CM

## 2011-07-29 LAB — HOLD TUBE, BLOOD BANK

## 2011-07-29 LAB — CBC WITH DIFFERENTIAL/PLATELET
BASO%: 0 % (ref 0.0–2.0)
Eosinophils Absolute: 0.1 10*3/uL (ref 0.0–0.5)
LYMPH%: 37.6 % (ref 14.0–49.0)
MCHC: 32 g/dL (ref 32.0–36.0)
MONO#: 0.4 10*3/uL (ref 0.1–0.9)
NEUT#: 1.4 10*3/uL — ABNORMAL LOW (ref 1.5–6.5)
Platelets: 27 10*3/uL — ABNORMAL LOW (ref 140–400)
RBC: 2.19 10*6/uL — ABNORMAL LOW (ref 4.20–5.82)
WBC: 3 10*3/uL — ABNORMAL LOW (ref 4.0–10.3)
lymph#: 1.1 10*3/uL (ref 0.9–3.3)

## 2011-07-30 LAB — CROSSMATCH
ABO/RH(D): A POS
Antibody Screen: NEGATIVE

## 2011-08-12 ENCOUNTER — Encounter (HOSPITAL_BASED_OUTPATIENT_CLINIC_OR_DEPARTMENT_OTHER): Payer: Medicare Other | Admitting: Oncology

## 2011-08-12 ENCOUNTER — Other Ambulatory Visit: Payer: Self-pay | Admitting: Oncology

## 2011-08-12 DIAGNOSIS — D61818 Other pancytopenia: Secondary | ICD-10-CM

## 2011-08-12 DIAGNOSIS — C9000 Multiple myeloma not having achieved remission: Secondary | ICD-10-CM

## 2011-08-12 LAB — CBC WITH DIFFERENTIAL/PLATELET
Basophils Absolute: 0 10*3/uL (ref 0.0–0.1)
EOS%: 2.3 % (ref 0.0–7.0)
Eosinophils Absolute: 0.1 10*3/uL (ref 0.0–0.5)
HCT: 22 % — ABNORMAL LOW (ref 38.4–49.9)
HGB: 7.1 g/dL — ABNORMAL LOW (ref 13.0–17.1)
MCH: 31.4 pg (ref 27.2–33.4)
MCV: 97.3 fL (ref 79.3–98.0)
MONO%: 11.4 % (ref 0.0–14.0)
NEUT#: 1.5 10*3/uL (ref 1.5–6.5)
NEUT%: 50.4 % (ref 39.0–75.0)

## 2011-08-13 LAB — CROSSMATCH: ABO/RH(D): A POS

## 2011-08-26 ENCOUNTER — Other Ambulatory Visit: Payer: Self-pay | Admitting: Oncology

## 2011-08-26 ENCOUNTER — Encounter (HOSPITAL_BASED_OUTPATIENT_CLINIC_OR_DEPARTMENT_OTHER): Payer: Medicare Other | Admitting: Oncology

## 2011-08-26 DIAGNOSIS — D696 Thrombocytopenia, unspecified: Secondary | ICD-10-CM

## 2011-08-26 DIAGNOSIS — D61818 Other pancytopenia: Secondary | ICD-10-CM

## 2011-08-26 DIAGNOSIS — C9002 Multiple myeloma in relapse: Secondary | ICD-10-CM

## 2011-08-26 DIAGNOSIS — C9000 Multiple myeloma not having achieved remission: Secondary | ICD-10-CM

## 2011-08-26 DIAGNOSIS — D709 Neutropenia, unspecified: Secondary | ICD-10-CM

## 2011-08-26 LAB — COMPREHENSIVE METABOLIC PANEL
AST: 17 U/L (ref 0–37)
Alkaline Phosphatase: 44 U/L (ref 39–117)
BUN: 9 mg/dL (ref 6–23)
Creatinine, Ser: 0.72 mg/dL (ref 0.50–1.35)

## 2011-08-26 LAB — CBC WITH DIFFERENTIAL/PLATELET
BASO%: 0 % (ref 0.0–2.0)
EOS%: 2.6 % (ref 0.0–7.0)
HCT: 21.6 % — ABNORMAL LOW (ref 38.4–49.9)
LYMPH%: 34.9 % (ref 14.0–49.0)
MCH: 31.1 pg (ref 27.2–33.4)
MCHC: 32.4 g/dL (ref 32.0–36.0)
MONO%: 8.6 % (ref 0.0–14.0)
NEUT%: 53.9 % (ref 39.0–75.0)
Platelets: 28 10*3/uL — ABNORMAL LOW (ref 140–400)

## 2011-09-03 ENCOUNTER — Other Ambulatory Visit: Payer: Self-pay | Admitting: Oncology

## 2011-09-03 ENCOUNTER — Encounter (HOSPITAL_COMMUNITY)
Admission: RE | Admit: 2011-09-03 | Discharge: 2011-09-03 | Disposition: A | Discharge status: Home / Self Care | Source: Ambulatory Visit | Attending: Oncology | Admitting: Oncology

## 2011-09-03 ENCOUNTER — Encounter (HOSPITAL_BASED_OUTPATIENT_CLINIC_OR_DEPARTMENT_OTHER): Payer: Medicare Other | Admitting: Oncology

## 2011-09-03 DIAGNOSIS — D696 Thrombocytopenia, unspecified: Secondary | ICD-10-CM

## 2011-09-03 DIAGNOSIS — D630 Anemia in neoplastic disease: Secondary | ICD-10-CM | POA: Insufficient documentation

## 2011-09-03 DIAGNOSIS — C9000 Multiple myeloma not having achieved remission: Secondary | ICD-10-CM | POA: Insufficient documentation

## 2011-09-03 DIAGNOSIS — D61818 Other pancytopenia: Secondary | ICD-10-CM

## 2011-09-03 LAB — CBC WITH DIFFERENTIAL/PLATELET
Basophils Absolute: 0 10*3/uL (ref 0.0–0.1)
EOS%: 3.4 % (ref 0.0–7.0)
HCT: 20.9 % — ABNORMAL LOW (ref 38.4–49.9)
HGB: 6.8 g/dL — CL (ref 13.0–17.1)
MCH: 31.8 pg (ref 27.2–33.4)
MCV: 97.7 fL (ref 79.3–98.0)
NEUT%: 45.8 % (ref 39.0–75.0)
lymph#: 1.2 10*3/uL (ref 0.9–3.3)

## 2011-09-03 LAB — TYPE & CROSSMATCH - CHCC

## 2011-09-04 LAB — CROSSMATCH: Unit division: 0

## 2011-09-06 ENCOUNTER — Encounter (HOSPITAL_COMMUNITY): Payer: Medicare Other

## 2011-09-09 ENCOUNTER — Encounter (HOSPITAL_BASED_OUTPATIENT_CLINIC_OR_DEPARTMENT_OTHER): Payer: Medicare Other | Admitting: Oncology

## 2011-09-09 ENCOUNTER — Other Ambulatory Visit: Payer: Self-pay | Admitting: Oncology

## 2011-09-09 DIAGNOSIS — C9000 Multiple myeloma not having achieved remission: Secondary | ICD-10-CM

## 2011-09-09 DIAGNOSIS — D61818 Other pancytopenia: Secondary | ICD-10-CM

## 2011-09-09 LAB — CBC WITH DIFFERENTIAL/PLATELET
BASO%: 0 % (ref 0.0–2.0)
EOS%: 2.8 % (ref 0.0–7.0)
MCH: 31.1 pg (ref 27.2–33.4)
MCHC: 31.9 g/dL — ABNORMAL LOW (ref 32.0–36.0)
RBC: 2.38 10*6/uL — ABNORMAL LOW (ref 4.20–5.82)
RDW: 20.2 % — ABNORMAL HIGH (ref 11.0–14.6)
WBC: 2.9 10*3/uL — ABNORMAL LOW (ref 4.0–10.3)
lymph#: 1 10*3/uL (ref 0.9–3.3)
nRBC: 0 % (ref 0–0)

## 2011-09-09 LAB — HOLD TUBE, BLOOD BANK

## 2011-09-23 ENCOUNTER — Other Ambulatory Visit: Payer: Self-pay | Admitting: Oncology

## 2011-09-23 ENCOUNTER — Encounter (HOSPITAL_BASED_OUTPATIENT_CLINIC_OR_DEPARTMENT_OTHER): Admitting: Oncology

## 2011-09-23 DIAGNOSIS — D61818 Other pancytopenia: Secondary | ICD-10-CM

## 2011-09-23 DIAGNOSIS — C9002 Multiple myeloma in relapse: Secondary | ICD-10-CM

## 2011-09-23 DIAGNOSIS — C9000 Multiple myeloma not having achieved remission: Secondary | ICD-10-CM

## 2011-09-23 LAB — CBC WITH DIFFERENTIAL/PLATELET
BASO%: 0 % (ref 0.0–2.0)
LYMPH%: 35.3 % (ref 14.0–49.0)
MCHC: 32.7 g/dL (ref 32.0–36.0)
MONO#: 0.4 10*3/uL (ref 0.1–0.9)
Platelets: 16 10*3/uL — ABNORMAL LOW (ref 140–400)
RBC: 2.09 10*6/uL — ABNORMAL LOW (ref 4.20–5.82)
RDW: 19.8 % — ABNORMAL HIGH (ref 11.0–14.6)
WBC: 3.4 10*3/uL — ABNORMAL LOW (ref 4.0–10.3)
lymph#: 1.2 10*3/uL (ref 0.9–3.3)
nRBC: 0 % (ref 0–0)

## 2011-09-23 LAB — TYPE & CROSSMATCH - CHCC

## 2011-09-24 LAB — CROSSMATCH
Antibody Screen: NEGATIVE
Unit division: 0

## 2011-10-07 ENCOUNTER — Encounter (HOSPITAL_COMMUNITY)
Admission: RE | Admit: 2011-10-07 | Discharge: 2011-10-07 | Disposition: A | Discharge status: Home / Self Care | Payer: Medicare Other | Source: Ambulatory Visit | Attending: Oncology | Admitting: Oncology

## 2011-10-07 ENCOUNTER — Other Ambulatory Visit: Payer: Self-pay | Admitting: Oncology

## 2011-10-07 ENCOUNTER — Encounter (HOSPITAL_BASED_OUTPATIENT_CLINIC_OR_DEPARTMENT_OTHER): Admitting: Oncology

## 2011-10-07 DIAGNOSIS — C9000 Multiple myeloma not having achieved remission: Secondary | ICD-10-CM | POA: Insufficient documentation

## 2011-10-07 DIAGNOSIS — D61818 Other pancytopenia: Secondary | ICD-10-CM

## 2011-10-07 DIAGNOSIS — D696 Thrombocytopenia, unspecified: Secondary | ICD-10-CM

## 2011-10-07 DIAGNOSIS — D630 Anemia in neoplastic disease: Secondary | ICD-10-CM | POA: Insufficient documentation

## 2011-10-07 DIAGNOSIS — D709 Neutropenia, unspecified: Secondary | ICD-10-CM

## 2011-10-07 DIAGNOSIS — C9002 Multiple myeloma in relapse: Secondary | ICD-10-CM

## 2011-10-07 LAB — CBC WITH DIFFERENTIAL/PLATELET
BASO%: 0 % (ref 0.0–2.0)
EOS%: 2.3 % (ref 0.0–7.0)
HGB: 6.6 g/dL — CL (ref 13.0–17.1)
MCH: 31.4 pg (ref 27.2–33.4)
MCHC: 33 g/dL (ref 32.0–36.0)
RDW: 19.2 % — ABNORMAL HIGH (ref 11.0–14.6)
lymph#: 1.4 10*3/uL (ref 0.9–3.3)
nRBC: 0 % (ref 0–0)

## 2011-10-07 LAB — HOLD TUBE, BLOOD BANK

## 2011-10-07 LAB — COMPREHENSIVE METABOLIC PANEL
ALT: 10 U/L (ref 0–53)
BUN: 14 mg/dL (ref 6–23)
CO2: 23 mEq/L (ref 19–32)
Calcium: 6.8 mg/dL — ABNORMAL LOW (ref 8.4–10.5)
Chloride: 98 mEq/L (ref 96–112)
Creatinine, Ser: 0.92 mg/dL (ref 0.50–1.35)
Glucose, Bld: 107 mg/dL — ABNORMAL HIGH (ref 70–99)
Total Bilirubin: 0.3 mg/dL (ref 0.3–1.2)

## 2011-10-07 LAB — TYPE & CROSSMATCH - CHCC

## 2011-10-08 LAB — CROSSMATCH: ABO/RH(D): A POS

## 2011-10-09 ENCOUNTER — Encounter (HOSPITAL_BASED_OUTPATIENT_CLINIC_OR_DEPARTMENT_OTHER): Admitting: Oncology

## 2011-10-09 DIAGNOSIS — R634 Abnormal weight loss: Secondary | ICD-10-CM

## 2011-10-09 DIAGNOSIS — C9002 Multiple myeloma in relapse: Secondary | ICD-10-CM

## 2011-10-09 DIAGNOSIS — E871 Hypo-osmolality and hyponatremia: Secondary | ICD-10-CM

## 2011-10-09 DIAGNOSIS — D696 Thrombocytopenia, unspecified: Secondary | ICD-10-CM

## 2011-10-09 DIAGNOSIS — G629 Polyneuropathy, unspecified: Secondary | ICD-10-CM

## 2011-10-10 ENCOUNTER — Encounter: Payer: Self-pay | Admitting: Oncology

## 2011-10-10 DIAGNOSIS — C9002 Multiple myeloma in relapse: Secondary | ICD-10-CM | POA: Insufficient documentation

## 2011-10-10 DIAGNOSIS — E119 Type 2 diabetes mellitus without complications: Secondary | ICD-10-CM | POA: Insufficient documentation

## 2011-10-10 DIAGNOSIS — E871 Hypo-osmolality and hyponatremia: Secondary | ICD-10-CM | POA: Insufficient documentation

## 2011-10-10 DIAGNOSIS — D61818 Other pancytopenia: Secondary | ICD-10-CM | POA: Insufficient documentation

## 2011-10-10 DIAGNOSIS — R634 Abnormal weight loss: Secondary | ICD-10-CM | POA: Insufficient documentation

## 2011-10-10 DIAGNOSIS — G629 Polyneuropathy, unspecified: Secondary | ICD-10-CM | POA: Insufficient documentation

## 2011-10-10 LAB — PREPARE PLATELET PHERESIS

## 2011-10-11 ENCOUNTER — Encounter (HOSPITAL_BASED_OUTPATIENT_CLINIC_OR_DEPARTMENT_OTHER): Admitting: Oncology

## 2011-10-11 ENCOUNTER — Other Ambulatory Visit: Payer: Self-pay | Admitting: Oncology

## 2011-10-11 DIAGNOSIS — C9000 Multiple myeloma not having achieved remission: Secondary | ICD-10-CM

## 2011-10-11 DIAGNOSIS — C9002 Multiple myeloma in relapse: Secondary | ICD-10-CM

## 2011-10-11 DIAGNOSIS — D709 Neutropenia, unspecified: Secondary | ICD-10-CM

## 2011-10-11 DIAGNOSIS — D696 Thrombocytopenia, unspecified: Secondary | ICD-10-CM

## 2011-10-11 LAB — CBC WITH DIFFERENTIAL/PLATELET
BASO%: 0.2 % (ref 0.0–2.0)
Basophils Absolute: 0 10*3/uL (ref 0.0–0.1)
EOS%: 2.3 % (ref 0.0–7.0)
HGB: 7.2 g/dL — ABNORMAL LOW (ref 13.0–17.1)
MCH: 30.6 pg (ref 27.2–33.4)
MCHC: 32.4 g/dL (ref 32.0–36.0)
MCV: 94.5 fL (ref 79.3–98.0)
MONO%: 8.1 % (ref 0.0–14.0)
NEUT%: 42.3 % (ref 39.0–75.0)
RDW: 19 % — ABNORMAL HIGH (ref 11.0–14.6)

## 2011-10-11 LAB — HOLD TUBE, BLOOD BANK

## 2011-10-17 ENCOUNTER — Other Ambulatory Visit (HOSPITAL_COMMUNITY): Payer: Self-pay | Admitting: Oncology

## 2011-10-17 DIAGNOSIS — C9002 Multiple myeloma in relapse: Secondary | ICD-10-CM

## 2011-10-19 ENCOUNTER — Encounter: Payer: Self-pay | Admitting: Oncology

## 2011-10-19 ENCOUNTER — Other Ambulatory Visit: Payer: Self-pay | Admitting: Oncology

## 2011-10-19 DIAGNOSIS — N400 Enlarged prostate without lower urinary tract symptoms: Secondary | ICD-10-CM | POA: Insufficient documentation

## 2011-10-21 ENCOUNTER — Other Ambulatory Visit: Payer: Self-pay | Admitting: Oncology

## 2011-10-21 ENCOUNTER — Encounter (HOSPITAL_BASED_OUTPATIENT_CLINIC_OR_DEPARTMENT_OTHER): Payer: Medicare Other | Admitting: Oncology

## 2011-10-21 DIAGNOSIS — C9000 Multiple myeloma not having achieved remission: Secondary | ICD-10-CM

## 2011-10-21 DIAGNOSIS — C9002 Multiple myeloma in relapse: Secondary | ICD-10-CM

## 2011-10-21 DIAGNOSIS — D709 Neutropenia, unspecified: Secondary | ICD-10-CM

## 2011-10-21 DIAGNOSIS — D696 Thrombocytopenia, unspecified: Secondary | ICD-10-CM

## 2011-10-21 LAB — CBC WITH DIFFERENTIAL/PLATELET
BASO%: 0.2 % (ref 0.0–2.0)
EOS%: 0.6 % (ref 0.0–7.0)
LYMPH%: 44.2 % (ref 14.0–49.0)
MCH: 30.5 pg (ref 27.2–33.4)
MCHC: 32.7 g/dL (ref 32.0–36.0)
MCV: 93.2 fL (ref 79.3–98.0)
MONO%: 7.7 % (ref 0.0–14.0)
NEUT#: 2.3 10*3/uL (ref 1.5–6.5)
Platelets: 7 10*3/uL — CL (ref 140–400)
RBC: 1.77 10*6/uL — ABNORMAL LOW (ref 4.20–5.82)
RDW: 19.1 % — ABNORMAL HIGH (ref 11.0–14.6)
nRBC: 1 % — ABNORMAL HIGH (ref 0–0)

## 2011-10-21 LAB — HOLD TUBE, BLOOD BANK

## 2011-10-22 ENCOUNTER — Other Ambulatory Visit: Payer: Self-pay | Admitting: Oncology

## 2011-10-22 LAB — CROSSMATCH: Unit division: 0

## 2011-10-22 LAB — PREPARE PLATELET PHERESIS: Unit division: 0

## 2011-10-23 ENCOUNTER — Emergency Department (HOSPITAL_COMMUNITY)

## 2011-10-23 ENCOUNTER — Emergency Department (HOSPITAL_COMMUNITY)
Admission: EM | Admit: 2011-10-23 | Discharge: 2011-10-31 | Disposition: E | Attending: Emergency Medicine | Admitting: Emergency Medicine

## 2011-10-23 DIAGNOSIS — R195 Other fecal abnormalities: Secondary | ICD-10-CM | POA: Insufficient documentation

## 2011-10-23 DIAGNOSIS — Z87898 Personal history of other specified conditions: Secondary | ICD-10-CM | POA: Insufficient documentation

## 2011-10-23 DIAGNOSIS — I629 Nontraumatic intracranial hemorrhage, unspecified: Secondary | ICD-10-CM | POA: Insufficient documentation

## 2011-10-23 DIAGNOSIS — R6889 Other general symptoms and signs: Secondary | ICD-10-CM | POA: Insufficient documentation

## 2011-10-23 DIAGNOSIS — R0602 Shortness of breath: Secondary | ICD-10-CM | POA: Insufficient documentation

## 2011-10-23 DIAGNOSIS — E119 Type 2 diabetes mellitus without complications: Secondary | ICD-10-CM | POA: Insufficient documentation

## 2011-10-23 DIAGNOSIS — I10 Essential (primary) hypertension: Secondary | ICD-10-CM | POA: Insufficient documentation

## 2011-10-23 DIAGNOSIS — R404 Transient alteration of awareness: Secondary | ICD-10-CM | POA: Insufficient documentation

## 2011-10-23 LAB — POCT I-STAT 3, ART BLOOD GAS (G3+)
Acid-Base Excess: 4 mmol/L — ABNORMAL HIGH (ref 0.0–2.0)
Bicarbonate: 28.2 mEq/L — ABNORMAL HIGH (ref 20.0–24.0)
Patient temperature: 102.4

## 2011-10-23 LAB — POCT I-STAT, CHEM 8
HCT: 26 % — ABNORMAL LOW (ref 39.0–52.0)
Hemoglobin: 8.8 g/dL — ABNORMAL LOW (ref 13.0–17.0)
Potassium: 3.7 mEq/L (ref 3.5–5.1)
Sodium: 147 mEq/L — ABNORMAL HIGH (ref 135–145)
TCO2: 25 mmol/L (ref 0–100)

## 2011-10-23 LAB — CBC
Hemoglobin: 6.2 g/dL — CL (ref 13.0–17.0)
MCHC: 33.7 g/dL (ref 30.0–36.0)
RDW: 18.4 % — ABNORMAL HIGH (ref 11.5–15.5)

## 2011-10-23 LAB — URINE MICROSCOPIC-ADD ON

## 2011-10-23 LAB — POCT I-STAT TROPONIN I: Troponin i, poc: 0.07 ng/mL (ref 0.00–0.08)

## 2011-10-23 LAB — DIFFERENTIAL
Basophils Absolute: 0 10*3/uL (ref 0.0–0.1)
Eosinophils Absolute: 0 10*3/uL (ref 0.0–0.7)
Lymphocytes Relative: 39 % (ref 12–46)
Lymphs Abs: 1.8 10*3/uL (ref 0.7–4.0)
Neutro Abs: 2.3 10*3/uL (ref 1.7–7.7)

## 2011-10-23 LAB — URINALYSIS, ROUTINE W REFLEX MICROSCOPIC
Specific Gravity, Urine: 1.033 — ABNORMAL HIGH (ref 1.005–1.030)
pH: 6 (ref 5.0–8.0)

## 2011-10-23 LAB — PROTIME-INR
INR: 1.72 — ABNORMAL HIGH (ref 0.00–1.49)
Prothrombin Time: 20.5 seconds — ABNORMAL HIGH (ref 11.6–15.2)

## 2011-10-23 LAB — OCCULT BLOOD, POC DEVICE: Fecal Occult Bld: POSITIVE

## 2011-10-24 DIAGNOSIS — I629 Nontraumatic intracranial hemorrhage, unspecified: Secondary | ICD-10-CM

## 2011-10-24 DIAGNOSIS — J13 Pneumonia due to Streptococcus pneumoniae: Secondary | ICD-10-CM

## 2011-10-24 DIAGNOSIS — G9382 Brain death: Secondary | ICD-10-CM

## 2011-10-24 DIAGNOSIS — J9600 Acute respiratory failure, unspecified whether with hypoxia or hypercapnia: Secondary | ICD-10-CM

## 2011-10-24 LAB — CROSSMATCH

## 2011-10-24 LAB — ABO/RH: ABO/RH(D): A POS

## 2011-10-24 LAB — URINE CULTURE: Culture: NO GROWTH

## 2011-10-30 LAB — CULTURE, BLOOD (ROUTINE X 2)
Culture  Setup Time: 201210250344
Culture  Setup Time: 201210250345
Culture: NO GROWTH

## 2011-10-31 DEATH — deceased

## 2011-11-04 ENCOUNTER — Ambulatory Visit

## 2011-11-04 ENCOUNTER — Other Ambulatory Visit: Admitting: Lab

## 2011-11-11 ENCOUNTER — Encounter

## 2011-11-11 ENCOUNTER — Other Ambulatory Visit: Admitting: Lab

## 2011-11-25 ENCOUNTER — Encounter

## 2011-11-25 ENCOUNTER — Other Ambulatory Visit: Admitting: Lab

## 2011-11-30 NOTE — Discharge Summary (Signed)
  NAMEDERRIC, DEALMEIDA              ACCOUNT NO.:  [id number]  MEDICAL RECORD NO.:  [id number]  LOCATION:  [facility]                         FACILITY:  [facility]  PHYSICIAN:  Veto Kemps, MDDATE OF BIRTH:  14-Sep-1951  DATE OF ADMISSION:  10/28/2011 DATE OF DISCHARGE:                              DISCHARGE SUMMARY   DEATH SUMMARY  FINAL DIAGNOSIS:  Intracranial hemorrhage.  SECONDARY DIAGNOSES: 1. Multiple myeloma. 2. Anemia. 3. Thrombocytopenia.  HISTORY OF PRESENT ILLNESS:  This is a 60 year old male with multiple myeloma, refractory therapy who presented to the emergency department on December 22, 2009, with altered mental status.  For further details of the history of present illness, please refer to my pulmonary critical care consult note written on October 24, 2011.  Exam on admission, please refer to pulmonary critical care note written on October 24, 2011.  HOSPITAL COURSE:  Intracranial hemorrhage.  After admission to the emergency room, the patient was admitted and was intubated for airway protection.  CT scan of his brain was performed.  This demonstrated a very large intracranial hemorrhage in the right frontal parenchyma of his brain.  There is associated midline shift and significant mass effect with  hydrocephalus.  Neurosurgery was called with these findings by the emergency department staff and they recommended withdrawal of care as there was nothing that could be done.  I was consulted for further management.  When I came to evaluate the patient, he had fixed pupils at 4-5 mm, which were not reactive to light.  No doll's eyes.  No gag reflex.  He had a GCS score of 3 and did not withdraw the painful stimuli.  I had lengthy discussions with the patient's wife, Aram Beecham as well as his 2 daughters and son and a niece.  They stated that he had been through quite bit in the last 2 years with his multiple myeloma and he would not want prolonged suffering.  I  recommended to them that we would withdraw support from the ventilator and focus our cares on his comfort.  They agreed with this plan and he was extubated.  Not long after extubation, he went into asystole and was pronounced dead at 12:46 midnight.  The family was at the bedside at the time of his death and at the time of my physical exam documenting his death.  They are updated of his condition and support services were offered through [employer].          ______________________________ Veto Kemps, MD    DBM/MEDQ  D:  10/21/2011  T:  10/23/2011  Job:  [id number]  Electronically Signed by Max Fickle MD on 11/06/2011 09:30:01 AM

## 2011-12-09 ENCOUNTER — Other Ambulatory Visit: Admitting: Lab

## 2011-12-09 ENCOUNTER — Encounter

## 2012-09-05 IMAGING — CT CT ANGIO CHEST
2 of 6 series · 18 of 46 positions shown · IV contrast (APPLIED)
Comparison: CT chest 01/21/2006.

CLINICAL DATA: Shortness of breath.  Chest pain.  Elevated D-
dimer.

CT ANGIOGRAPHY CHEST WITH CONTRAST 01/29/2011:
TECHNIQUE: Multidetector CT imaging of the chest was performed
using the standard protocol during bolus administration of
intravenous contrast.  Multiplanar CT image reconstructions
including MIPs were obtained to evaluate the vascular anatomy.
Contrast:  100 ml Fmnipaque-0PP IV.

[Series 5: pe 1.0 b20f st · axial · 0.67mm/px · z∈[-72,+137]mm · 15 of 229 slices shown]
[im 10/229  lung]
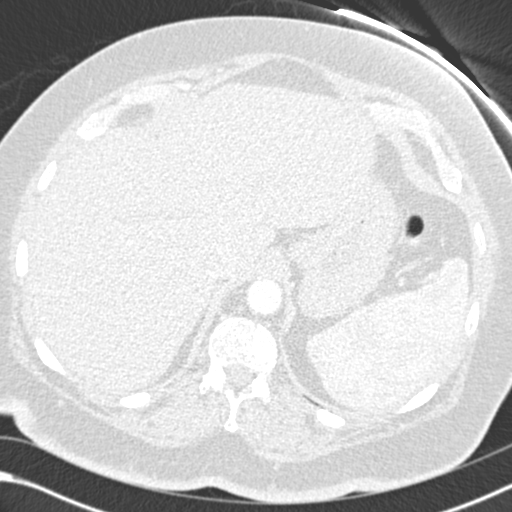
[im 30/229  soft-tissue]
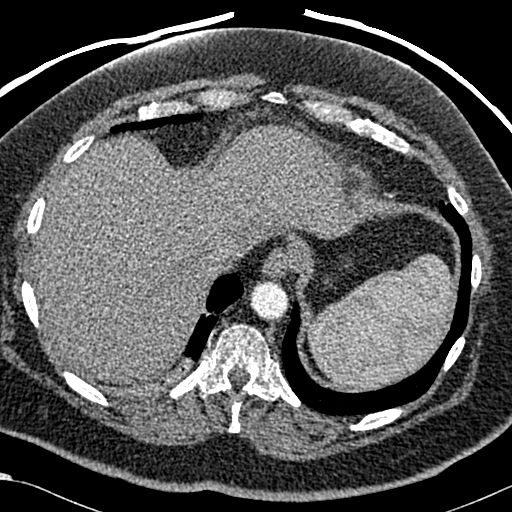
[im 40/229  lung]
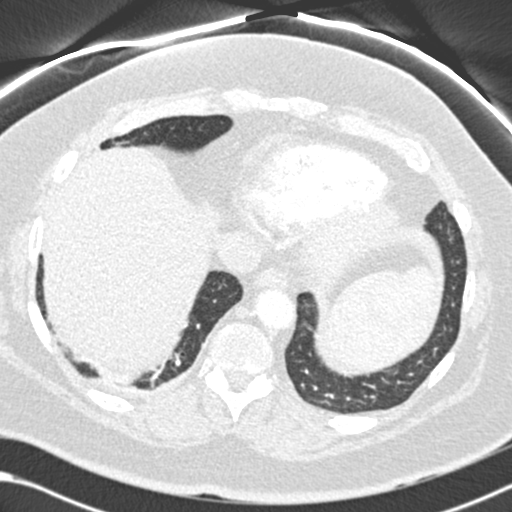
[im 60/229  soft-tissue]
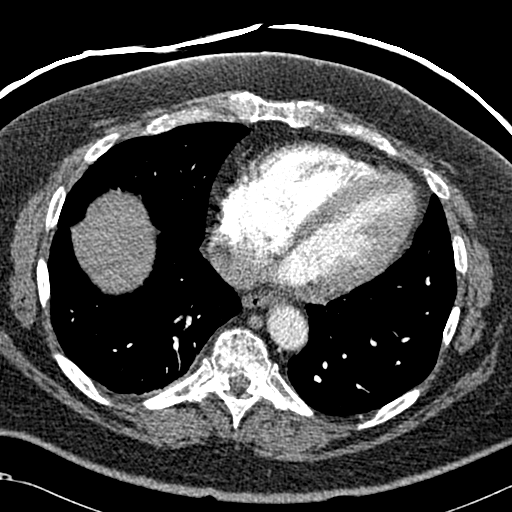
[im 70/229  lung]
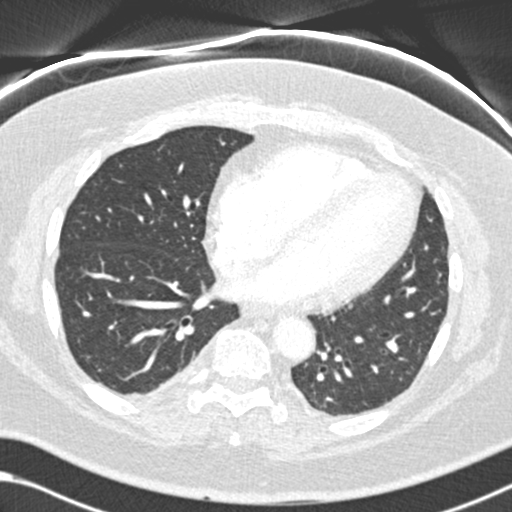
[im 90/229  soft-tissue]
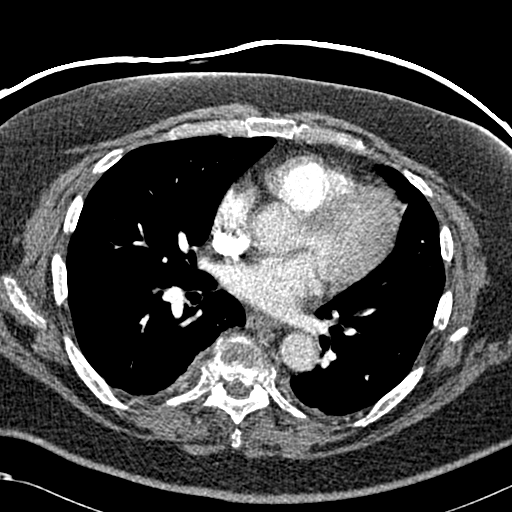
[im 100/229  lung]
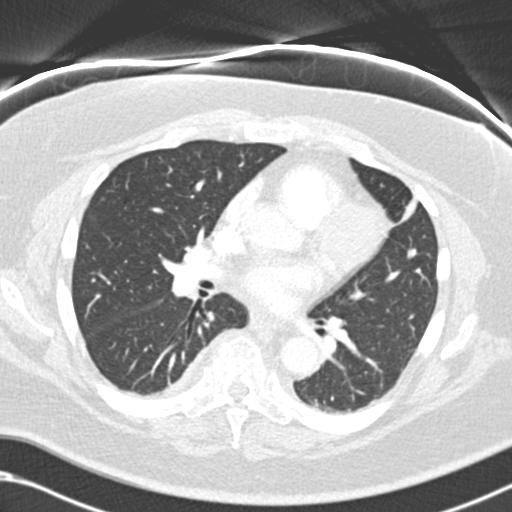
[im 119/229  soft-tissue]
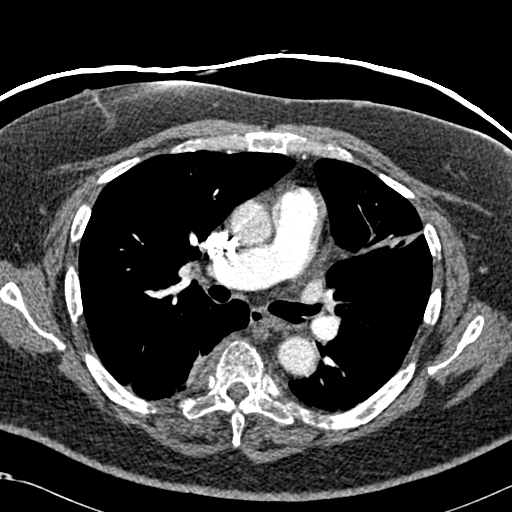
[im 129/229  lung]
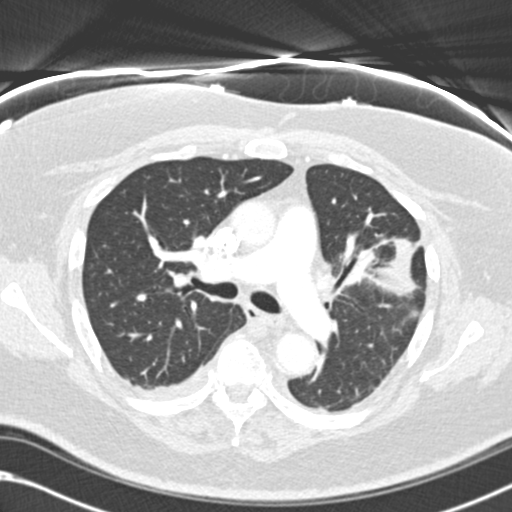
[im 139/229  soft-tissue]
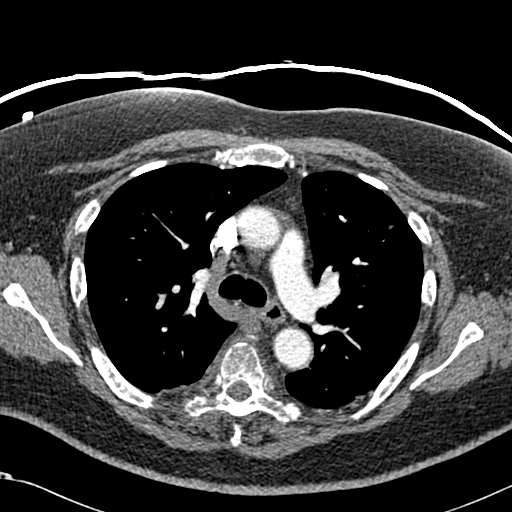
[im 159/229  lung]
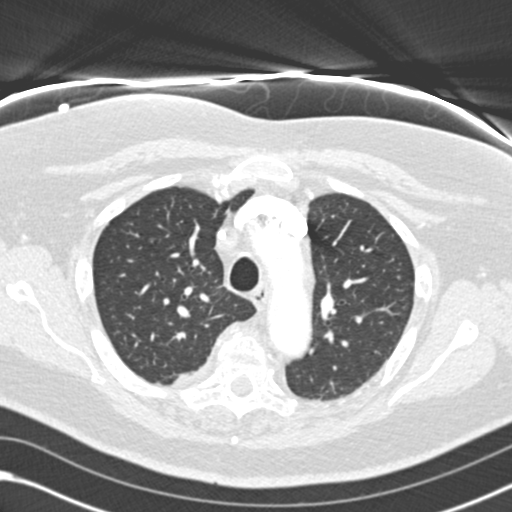
[im 169/229  soft-tissue]
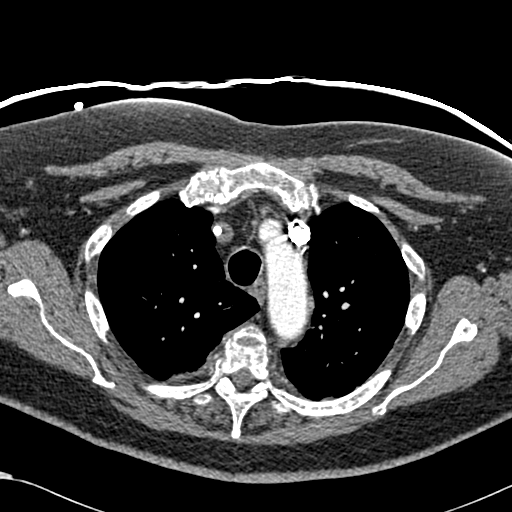
[im 189/229  lung]
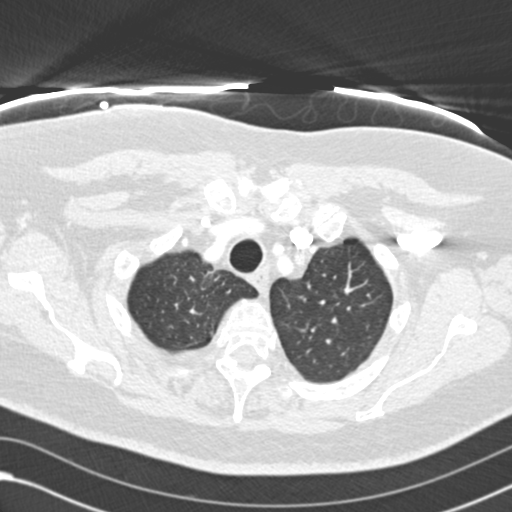
[im 199/229  soft-tissue]
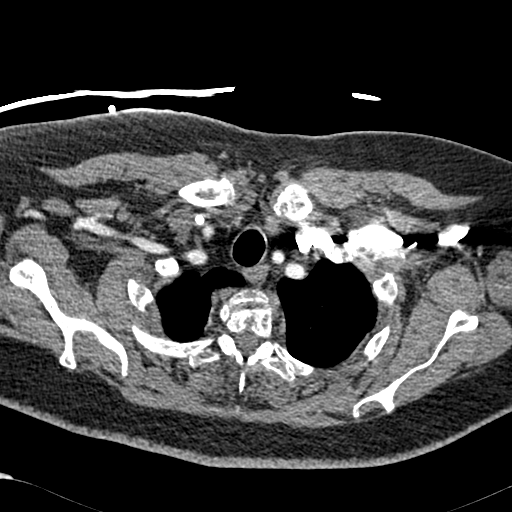
[im 219/229  lung]
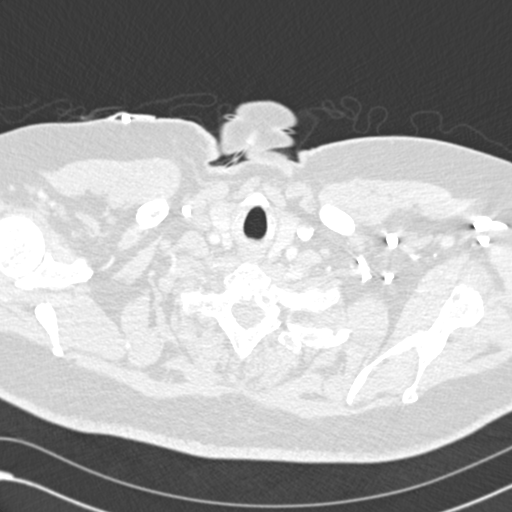

[Series 602: coronal mpr · coronal · 0.67mm/px · 3 of 116 slices shown]
[im 29/116  soft-tissue]
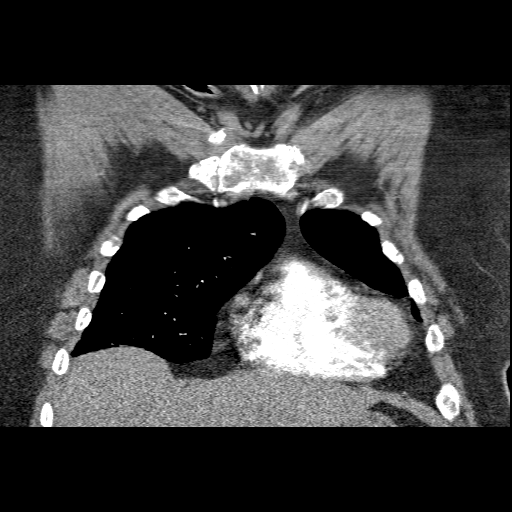
[im 58/116  soft-tissue]
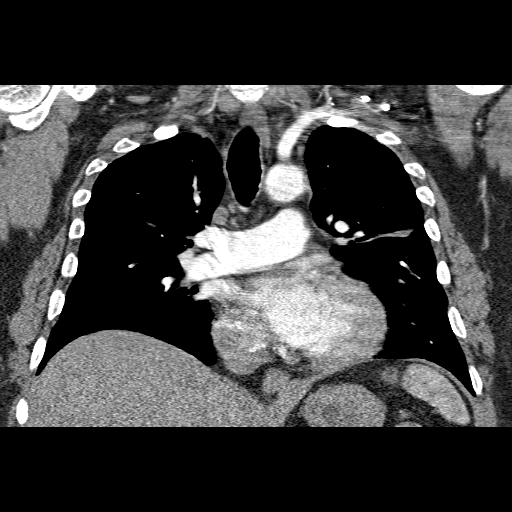
[im 87/116  soft-tissue]
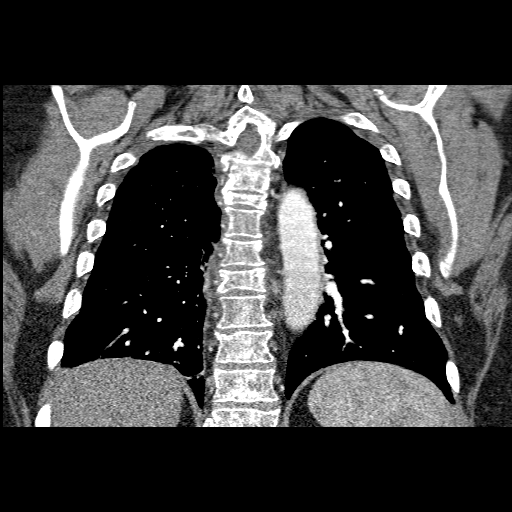

[18 of 46 positions shown; findings below may reference images not displayed]

FINDINGS: Contrast opacification of pulmonary arteries is good.
No filling defects within either main pulmonary artery or their
branches in either lung to suggest pulmonary embolism.  Heart
mildly enlarged with left ventricular predominance.  No visible
coronary artery calcification.  No pericardial effusion.  Mild
atherosclerosis involving the thoracic and upper abdominal aorta
without aneurysm or dissection.  Proximal great vessels widely
patent.  Right jugular central venous catheter tip at the
cavoatrial junction.

Linear scarring and/or atelectasis involving the inferior left
upper lobe.  Minimal atelectasis or scarring deep in the right
lower lobe.  Pulmonary parenchyma otherwise clear without localized
airspace consolidation, interstitial disease, or nodularity.  No
pleural effusions.  Mild pleural thickening bilaterally, right
greater than left.

No significant mediastinal, hilar, or axillary lymphadenopathy.
Visualized thyroid gland unremarkable.  Visualized upper abdomen
unremarkable for the early arterial phase of enhancement.  Bone
window images demonstrate severe osteopenia with compression
fractures of T4, T5, and T8, greatest at T5.  There are also
numerous round lucent lesions throughout the visualized thoracic
spine and throughout the ribs.

Review of the MIP images confirms the above findings.
IMPRESSION: 1.  No evidence of pulmonary embolism.
2.  Linear atelectasis or scarring in the inferior left upper lobe
and minimally in the right lower lobe.  No acute cardiopulmonary
disease otherwise.
3.  Mild cardiomegaly without evidence of pulmonary edema.
4.  Severe generalized osteopenia and numerous lucent lesions
throughout the thoracic spine and ribs; does the the patient have a
history of multiple myeloma?  There are compression fractures of
T4, T5, and T8.

## 2015-02-03 ENCOUNTER — Telehealth: Payer: Self-pay | Admitting: *Deleted

## 2015-02-03 NOTE — Telephone Encounter (Signed)
On 02-03-15 fax medical records to [facility] attorneys at law , it was Dr. Pablo Ledger notes.
